# Patient Record
Sex: Female | Born: 1955 | Race: White | Hispanic: No | Smoking: Never smoker
Health system: Southern US, Community
[De-identification: ages and names within clinical notes are randomized; demographics above are authoritative.]

## PROBLEM LIST (undated history)

## (undated) DIAGNOSIS — F329 Major depressive disorder, single episode, unspecified: Secondary | ICD-10-CM

## (undated) DIAGNOSIS — M810 Age-related osteoporosis without current pathological fracture: Secondary | ICD-10-CM

## (undated) DIAGNOSIS — H539 Unspecified visual disturbance: Secondary | ICD-10-CM

## (undated) DIAGNOSIS — F32A Depression, unspecified: Secondary | ICD-10-CM

## (undated) DIAGNOSIS — M1990 Unspecified osteoarthritis, unspecified site: Secondary | ICD-10-CM

## (undated) DIAGNOSIS — R51 Headache: Secondary | ICD-10-CM

## (undated) DIAGNOSIS — G5600 Carpal tunnel syndrome, unspecified upper limb: Secondary | ICD-10-CM

## (undated) DIAGNOSIS — E785 Hyperlipidemia, unspecified: Secondary | ICD-10-CM

## (undated) DIAGNOSIS — G4700 Insomnia, unspecified: Secondary | ICD-10-CM

## (undated) DIAGNOSIS — I10 Essential (primary) hypertension: Secondary | ICD-10-CM

## (undated) DIAGNOSIS — F419 Anxiety disorder, unspecified: Secondary | ICD-10-CM

## (undated) DIAGNOSIS — R519 Headache, unspecified: Secondary | ICD-10-CM

## (undated) HISTORY — DX: Headache: R51

## (undated) HISTORY — DX: Unspecified visual disturbance: H53.9

## (undated) HISTORY — DX: Essential (primary) hypertension: I10

## (undated) HISTORY — PX: COSMETIC SURGERY: SHX468

## (undated) HISTORY — DX: Anxiety disorder, unspecified: F41.9

## (undated) HISTORY — PX: CARPAL TUNNEL RELEASE: SHX101

## (undated) HISTORY — DX: Hyperlipidemia, unspecified: E78.5

## (undated) HISTORY — DX: Age-related osteoporosis without current pathological fracture: M81.0

## (undated) HISTORY — DX: Major depressive disorder, single episode, unspecified: F32.9

## (undated) HISTORY — DX: Unspecified osteoarthritis, unspecified site: M19.90

## (undated) HISTORY — DX: Insomnia, unspecified: G47.00

## (undated) HISTORY — DX: Headache, unspecified: R51.9

## (undated) HISTORY — DX: Depression, unspecified: F32.A

## (undated) HISTORY — DX: Carpal tunnel syndrome, unspecified upper limb: G56.00

---

## 1997-11-30 ENCOUNTER — Ambulatory Visit (HOSPITAL_COMMUNITY): Admission: RE | Admit: 1997-11-30 | Discharge: 1997-11-30 | Payer: Self-pay | Admitting: Orthopedic Surgery

## 1997-11-30 ENCOUNTER — Encounter: Payer: Self-pay | Admitting: Orthopedic Surgery

## 2001-01-20 ENCOUNTER — Other Ambulatory Visit: Admission: RE | Admit: 2001-01-20 | Discharge: 2001-01-20 | Payer: Self-pay | Admitting: *Deleted

## 2015-05-18 DIAGNOSIS — E782 Mixed hyperlipidemia: Secondary | ICD-10-CM | POA: Diagnosis not present

## 2015-05-18 DIAGNOSIS — I10 Essential (primary) hypertension: Secondary | ICD-10-CM | POA: Diagnosis not present

## 2015-05-18 DIAGNOSIS — Z6829 Body mass index (BMI) 29.0-29.9, adult: Secondary | ICD-10-CM | POA: Diagnosis not present

## 2015-05-18 DIAGNOSIS — E669 Obesity, unspecified: Secondary | ICD-10-CM | POA: Diagnosis not present

## 2015-06-06 DIAGNOSIS — I10 Essential (primary) hypertension: Secondary | ICD-10-CM | POA: Diagnosis not present

## 2015-06-06 DIAGNOSIS — R21 Rash and other nonspecific skin eruption: Secondary | ICD-10-CM | POA: Diagnosis not present

## 2015-06-06 DIAGNOSIS — E669 Obesity, unspecified: Secondary | ICD-10-CM | POA: Diagnosis not present

## 2015-06-06 DIAGNOSIS — Z6829 Body mass index (BMI) 29.0-29.9, adult: Secondary | ICD-10-CM | POA: Diagnosis not present

## 2015-06-14 DIAGNOSIS — Z01419 Encounter for gynecological examination (general) (routine) without abnormal findings: Secondary | ICD-10-CM | POA: Diagnosis not present

## 2015-06-14 DIAGNOSIS — Z0000 Encounter for general adult medical examination without abnormal findings: Secondary | ICD-10-CM | POA: Diagnosis not present

## 2015-06-14 DIAGNOSIS — E559 Vitamin D deficiency, unspecified: Secondary | ICD-10-CM | POA: Diagnosis not present

## 2015-06-20 DIAGNOSIS — Z6829 Body mass index (BMI) 29.0-29.9, adult: Secondary | ICD-10-CM | POA: Diagnosis not present

## 2015-06-20 DIAGNOSIS — E669 Obesity, unspecified: Secondary | ICD-10-CM | POA: Diagnosis not present

## 2015-06-20 DIAGNOSIS — R21 Rash and other nonspecific skin eruption: Secondary | ICD-10-CM | POA: Diagnosis not present

## 2015-07-06 ENCOUNTER — Encounter: Payer: Self-pay | Admitting: Physician Assistant

## 2015-07-06 DIAGNOSIS — L905 Scar conditions and fibrosis of skin: Secondary | ICD-10-CM | POA: Diagnosis not present

## 2015-07-06 DIAGNOSIS — N6489 Other specified disorders of breast: Secondary | ICD-10-CM | POA: Diagnosis not present

## 2015-08-01 DIAGNOSIS — Z6829 Body mass index (BMI) 29.0-29.9, adult: Secondary | ICD-10-CM | POA: Diagnosis not present

## 2015-08-01 DIAGNOSIS — E669 Obesity, unspecified: Secondary | ICD-10-CM | POA: Diagnosis not present

## 2015-09-12 DIAGNOSIS — Z6829 Body mass index (BMI) 29.0-29.9, adult: Secondary | ICD-10-CM | POA: Diagnosis not present

## 2015-09-12 DIAGNOSIS — Z008 Encounter for other general examination: Secondary | ICD-10-CM | POA: Diagnosis not present

## 2015-09-12 DIAGNOSIS — I10 Essential (primary) hypertension: Secondary | ICD-10-CM | POA: Diagnosis not present

## 2015-09-12 DIAGNOSIS — E782 Mixed hyperlipidemia: Secondary | ICD-10-CM | POA: Diagnosis not present

## 2015-09-22 ENCOUNTER — Other Ambulatory Visit: Payer: Self-pay | Admitting: *Deleted

## 2015-09-22 NOTE — Telephone Encounter (Signed)
Needs to contact [facility] to continue narcotics meds

## 2015-09-22 NOTE — Telephone Encounter (Signed)
Pharmacy notified need to contact [facility] for refills

## 2015-09-26 NOTE — Telephone Encounter (Signed)
Monica Cross, pt received refill from Dr. Melina Copa. & home phone # is correct

## 2015-09-26 NOTE — Telephone Encounter (Signed)
Home # is not correct & pt does not work at the listed # either.

## 2015-10-19 DIAGNOSIS — E663 Overweight: Secondary | ICD-10-CM | POA: Diagnosis not present

## 2015-10-19 DIAGNOSIS — Z6829 Body mass index (BMI) 29.0-29.9, adult: Secondary | ICD-10-CM | POA: Diagnosis not present

## 2015-10-19 DIAGNOSIS — R5383 Other fatigue: Secondary | ICD-10-CM | POA: Diagnosis not present

## 2015-11-07 DIAGNOSIS — I10 Essential (primary) hypertension: Secondary | ICD-10-CM | POA: Diagnosis not present

## 2015-11-07 DIAGNOSIS — E782 Mixed hyperlipidemia: Secondary | ICD-10-CM | POA: Diagnosis not present

## 2015-11-07 DIAGNOSIS — R899 Unspecified abnormal finding in specimens from other organs, systems and tissues: Secondary | ICD-10-CM | POA: Diagnosis not present

## 2015-11-07 DIAGNOSIS — E559 Vitamin D deficiency, unspecified: Secondary | ICD-10-CM | POA: Diagnosis not present

## 2015-11-09 ENCOUNTER — Ambulatory Visit (INDEPENDENT_AMBULATORY_CARE_PROVIDER_SITE_OTHER): Payer: BLUE CROSS/BLUE SHIELD | Admitting: Physician Assistant

## 2015-11-09 ENCOUNTER — Encounter: Payer: Self-pay | Admitting: Physician Assistant

## 2015-11-09 VITALS — BP 143/81 | HR 72 | Temp 97.1°F | Ht 64.0 in | Wt 183.2 lb

## 2015-11-09 DIAGNOSIS — M170 Bilateral primary osteoarthritis of knee: Secondary | ICD-10-CM | POA: Diagnosis not present

## 2015-11-09 DIAGNOSIS — R51 Headache: Secondary | ICD-10-CM | POA: Diagnosis not present

## 2015-11-09 DIAGNOSIS — R519 Headache, unspecified: Secondary | ICD-10-CM

## 2015-11-09 DIAGNOSIS — F411 Generalized anxiety disorder: Secondary | ICD-10-CM | POA: Diagnosis not present

## 2015-11-09 DIAGNOSIS — I10 Essential (primary) hypertension: Secondary | ICD-10-CM | POA: Insufficient documentation

## 2015-11-09 DIAGNOSIS — Z6831 Body mass index (BMI) 31.0-31.9, adult: Secondary | ICD-10-CM

## 2015-11-09 DIAGNOSIS — E785 Hyperlipidemia, unspecified: Secondary | ICD-10-CM | POA: Insufficient documentation

## 2015-11-09 DIAGNOSIS — G4700 Insomnia, unspecified: Secondary | ICD-10-CM | POA: Diagnosis not present

## 2015-11-09 DIAGNOSIS — F339 Major depressive disorder, recurrent, unspecified: Secondary | ICD-10-CM | POA: Insufficient documentation

## 2015-11-09 MED ORDER — CITALOPRAM HYDROBROMIDE 20 MG PO TABS: 20.0000 mg | ORAL_TABLET | Freq: Every day | ORAL | 5 refills | Status: DC

## 2015-11-09 MED ORDER — ALPRAZOLAM 1 MG PO TABS: 1.0000 mg | ORAL_TABLET | Freq: Three times a day (TID) | ORAL | 2 refills | Status: DC | PRN

## 2015-11-09 MED ORDER — BUSPIRONE HCL 10 MG PO TABS: 10.0000 mg | ORAL_TABLET | Freq: Three times a day (TID) | ORAL | 2 refills | Status: DC

## 2015-11-09 MED ORDER — SUMATRIPTAN SUCCINATE 50 MG PO TABS: 50.0000 mg | ORAL_TABLET | ORAL | 2 refills | Status: DC | PRN

## 2015-11-09 MED ORDER — LOSARTAN POTASSIUM 100 MG PO TABS: 100.0000 mg | ORAL_TABLET | Freq: Every day | ORAL | 6 refills | Status: DC

## 2015-11-09 MED ORDER — DICLOFENAC SODIUM 75 MG PO TBEC: 75.0000 mg | DELAYED_RELEASE_TABLET | Freq: Two times a day (BID) | ORAL | 6 refills | Status: DC

## 2015-11-09 NOTE — Progress Notes (Signed)
BP (!) 143/81   Pulse 72   Temp 97.1 F (36.2 C) (Oral)   Ht 5\' 4"  (1.626 m)   Wt 183 lb 3.2 oz (83.1 kg)   BMI 31.45 kg/m    Subjective:    Patient ID: Monica Cross, female    DOB: Oct 01, 1955, 60 y.o.   MRN: [id number]  Monica Cross is a 60 y.o. female presenting on 11/09/2015 for Headache and Insomnia Patient here to be established as new patient at [facility].  This patient is known to me from [facility]. She needs some of her medications refilled. She has not had her blood pressure medicine for 3 or 4 days. This is most likely what she has a reasonable blood pressure. We will send refills on the things that she needs. She does have a couple of other issues to discuss today.  Headache   This is a new problem. The current episode started more than 1 month ago. The problem occurs intermittently. The problem has been waxing and waning. The pain is located in the right unilateral region. The pain does not radiate. The pain quality is similar to prior headaches. The quality of the pain is described as aching and pulsating. The pain is at a severity of 6/10. The pain is moderate. Associated symptoms include insomnia, nausea, phonophobia, photophobia, scalp tenderness, a visual change and vomiting. Pertinent negatives include no abdominal pain, coughing, fever, numbness or tinnitus. The symptoms are aggravated by bright light and emotional stress. She has tried cold packs and acetaminophen for the symptoms.  Insomnia  Primary symptoms: fragmented sleep, frequent awakening.  The current episode started more than one month. The onset quality is undetermined. The problem occurs every several days. The problem has been gradually worsening since onset. The symptoms are aggravated by emotional upset, work stress and family issues. How many beverages per day that contain caffeine: 0 - 1.  Types of beverages you drink: soda. Nothing relieves the symptoms. Past  treatments include medication. The treatment provided no relief. Typical bedtime:  10-11 P.M..  How long after going to bed to you fall asleep: over an hour.   PMH includes: family stress or anxiety. Prior diagnostic workup includes:  No prior workup.    Relevant past medical, surgical, family and social history reviewed and updated as indicated. Interim medical history since our last visit reviewed. Allergies and medications reviewed and updated.   Data reviewed from any sources in EPIC.  Review of Systems  Constitutional: Negative.  Negative for activity change, fatigue and fever.  HENT: Negative for tinnitus.   Eyes: Positive for photophobia.  Respiratory: Negative.  Negative for cough.   Cardiovascular: Negative.  Negative for chest pain.  Gastrointestinal: Positive for nausea and vomiting. Negative for abdominal pain.  Endocrine: Negative.   Genitourinary: Negative.  Negative for dysuria.  Musculoskeletal: Negative.   Skin: Negative.   Neurological: Positive for headaches. Negative for numbness.  Psychiatric/Behavioral: The patient has insomnia.     Per HPI unless specifically indicated above  Social History   Social History  . Marital status: Single    Spouse name: N/A  . Number of children: N/A  . Years of education: N/A   Occupational History  . Not on file.   Social History Main Topics  . Smoking status: Never Smoker  . Smokeless tobacco: Never Used  . Alcohol use No  . Drug use: No  . Sexual activity: Not on file   Other Topics Concern  .  Not on file   Social History Narrative  . No narrative on file    Past Surgical History:  Procedure Laterality Date  . CARPAL TUNNEL RELEASE    . CESAREAN SECTION    . COSMETIC SURGERY      Family History  Problem Relation Age of Onset  . Heart disease Mother   . Hypertension Mother       Medication List       Accurate as of 11/09/15  3:54 PM. Always use your most recent med list.          ALPRAZolam 1  MG tablet Commonly known as:  XANAX Take 1 tablet (1 mg total) by mouth 3 (three) times daily as needed for anxiety.   atorvastatin 20 MG tablet Commonly known as:  LIPITOR Take 20 mg by mouth daily.   busPIRone 10 MG tablet Commonly known as:  BUSPAR Take 1 tablet (10 mg total) by mouth 3 (three) times daily.   citalopram 20 MG tablet Commonly known as:  CELEXA Take 1 tablet (20 mg total) by mouth daily.   diclofenac 75 MG EC tablet Commonly known as:  VOLTAREN Take 1 tablet (75 mg total) by mouth 2 (two) times daily.   fenofibrate 145 MG tablet Commonly known as:  TRICOR Take 145 mg by mouth daily.   losartan 100 MG tablet Commonly known as:  COZAAR Take 1 tablet (100 mg total) by mouth daily.   SUMAtriptan 50 MG tablet Commonly known as:  IMITREX Take 1 tablet (50 mg total) by mouth every 2 (two) hours as needed for migraine. May repeat in 2 hours if headache persists or recurs.          Objective:    BP (!) 143/81   Pulse 72   Temp 97.1 F (36.2 C) (Oral)   Ht 5\' 4"  (1.626 m)   Wt 183 lb 3.2 oz (83.1 kg)   BMI 31.45 kg/m   No Known Allergies Wt Readings from Last 3 Encounters:  11/09/15 183 lb 3.2 oz (83.1 kg)    Physical Exam  Constitutional: She is oriented to person, place, and time. She appears well-developed and well-nourished.  HENT:  Head: Normocephalic and atraumatic.  Eyes: Conjunctivae and EOM are normal. Pupils are equal, round, and reactive to light.  Cardiovascular: Normal rate, regular rhythm, normal heart sounds and intact distal pulses.   Pulmonary/Chest: Effort normal and breath sounds normal.  Abdominal: Soft. Bowel sounds are normal.  Neurological: She is alert and oriented to person, place, and time. She has normal reflexes.  Skin: Skin is warm and dry. No rash noted.  Psychiatric: She has a normal mood and affect. Her behavior is normal. Judgment and thought content normal.    No results found for this or any previous visit.      Assessment & Plan:   1. Nonintractable headache, unspecified chronicity pattern, unspecified headache type - SUMAtriptan (IMITREX) 50 MG tablet; Take 1 tablet (50 mg total) by mouth every 2 (two) hours as needed for migraine. May repeat in 2 hours if headache persists or recurs.  Dispense: 10 tablet; Refill: 2  2. Insomnia  3. Essential hypertension - losartan (COZAAR) 100 MG tablet; Take 1 tablet (100 mg total) by mouth daily.  Dispense: 30 tablet; Refill: 6  4. Generalized anxiety disorder - busPIRone (BUSPAR) 10 MG tablet; Take 1 tablet (10 mg total) by mouth 3 (three) times daily.  Dispense: 90 tablet; Refill: 2 - citalopram (CELEXA) 20 MG tablet;  Take 1 tablet (20 mg total) by mouth daily.  Dispense: 30 tablet; Refill: 5 - ALPRAZolam (XANAX) 1 MG tablet; Take 1 tablet (1 mg total) by mouth 3 (three) times daily as needed for anxiety.  Dispense: 30 tablet; Refill: 2  5. Primary osteoarthritis of both knees - diclofenac (VOLTAREN) 75 MG EC tablet; Take 1 tablet (75 mg total) by mouth 2 (two) times daily.  Dispense: 60 tablet; Refill: 6  6. Body mass index 31.0-31.9, adult Walking and stretching   Continue all other maintenance medications as listed above. Educational handout given for Vitamin B12 deficiency. Her work labs showed her B12 to be at the low end of normal.  Follow up plan: Return in about 4 weeks (around 12/07/2015).  Terald Sleeper PA-C [facility] [address]  [place], [place] [address] [phone]   11/09/2015, 3:54 PM

## 2015-11-09 NOTE — Patient Instructions (Signed)

## 2015-12-07 ENCOUNTER — Ambulatory Visit: Payer: BLUE CROSS/BLUE SHIELD | Admitting: Physician Assistant

## 2015-12-13 ENCOUNTER — Ambulatory Visit: Payer: BLUE CROSS/BLUE SHIELD | Admitting: Physician Assistant

## 2015-12-13 ENCOUNTER — Ambulatory Visit (INDEPENDENT_AMBULATORY_CARE_PROVIDER_SITE_OTHER): Payer: BLUE CROSS/BLUE SHIELD | Admitting: Physician Assistant

## 2015-12-13 ENCOUNTER — Encounter: Payer: Self-pay | Admitting: Physician Assistant

## 2015-12-13 VITALS — BP 134/84 | HR 78 | Temp 98.3°F | Ht 64.0 in | Wt 184.4 lb

## 2015-12-13 DIAGNOSIS — M62838 Other muscle spasm: Secondary | ICD-10-CM | POA: Diagnosis not present

## 2015-12-13 DIAGNOSIS — F411 Generalized anxiety disorder: Secondary | ICD-10-CM

## 2015-12-13 MED ORDER — METHYLPREDNISOLONE ACETATE 80 MG/ML IJ SUSP
80.0000 mg | Freq: Once | INTRAMUSCULAR | Status: AC
  Administered 2015-12-13: 80 mg via INTRAMUSCULAR

## 2015-12-13 MED ORDER — METHOCARBAMOL 500 MG PO TABS: 500.0000 mg | ORAL_TABLET | Freq: Three times a day (TID) | ORAL | 1 refills | Status: DC | PRN

## 2015-12-13 MED ORDER — ALPRAZOLAM 1 MG PO TABS: 1.0000 mg | ORAL_TABLET | Freq: Three times a day (TID) | ORAL | 2 refills | Status: DC | PRN

## 2015-12-13 NOTE — Patient Instructions (Signed)

## 2015-12-14 ENCOUNTER — Encounter: Payer: Self-pay | Admitting: Physician Assistant

## 2015-12-14 DIAGNOSIS — E559 Vitamin D deficiency, unspecified: Secondary | ICD-10-CM | POA: Diagnosis not present

## 2015-12-14 DIAGNOSIS — R899 Unspecified abnormal finding in specimens from other organs, systems and tissues: Secondary | ICD-10-CM | POA: Diagnosis not present

## 2015-12-14 DIAGNOSIS — E782 Mixed hyperlipidemia: Secondary | ICD-10-CM | POA: Diagnosis not present

## 2015-12-15 NOTE — Progress Notes (Signed)
BP 134/84   Pulse 78   Temp 98.3 F (36.8 C) (Oral)   Ht 5\' 4"  (1.626 m)   Wt 184 lb 6 oz (83.6 kg)   BMI 31.65 kg/m    Subjective:    Patient ID: Monica Cross, female    DOB: 02-21-1955, 60 y.o.   MRN: [id number]  HPI: Monica Cross is a 60 y.o. female presenting on 12/13/2015 for Follow-up (headaches, depression) and Back Pain (muscle spasms on both sides of back, began Sunday night)  This patient is doing much better as far as her headaches and depression is going. The medication is quite good at this time. She states she would like to continue with this amount. We'll plan to recheck that in 3 months.  Patient has had a new onset of cervical muscle spasms. It does go somewhat down between her shoulder blades. She doesn't report any specific injury. She does work at a [employer] with repetitious upper arm movements. She denies any fever or chills.  Past Medical History:  Diagnosis Date  . Depression   . Hyperlipidemia   . Hypertension    Relevant past medical, surgical, family and social history reviewed and updated as indicated. Interim medical history since our last visit reviewed. Allergies and medications reviewed and updated. DATA REVIEWED: CHART IN EPIC  Social History   Social History  . Marital status: Single    Spouse name: N/A  . Number of children: N/A  . Years of education: N/A   Occupational History  . Not on file.   Social History Main Topics  . Smoking status: Never Smoker  . Smokeless tobacco: Never Used  . Alcohol use No  . Drug use: No  . Sexual activity: Not on file   Other Topics Concern  . Not on file   Social History Narrative  . No narrative on file    Past Surgical History:  Procedure Laterality Date  . CARPAL TUNNEL RELEASE    . CESAREAN SECTION    . COSMETIC SURGERY      Family History  Problem Relation Age of Onset  . Heart disease Mother   . Hypertension Mother     Review of Systems  Constitutional: Negative.   Negative for activity change, fatigue and fever.  HENT: Negative.   Eyes: Negative.   Respiratory: Negative.  Negative for cough.   Cardiovascular: Negative.  Negative for chest pain.  Gastrointestinal: Negative.  Negative for abdominal pain.  Endocrine: Negative.   Genitourinary: Negative.  Negative for dysuria.  Musculoskeletal: Positive for arthralgias, back pain, neck pain and neck stiffness.  Skin: Negative.   Neurological: Negative.       Medication List       Accurate as of 12/13/15 11:59 PM. Always use your most recent med list.          ALPRAZolam 1 MG tablet Commonly known as:  XANAX Take 1 tablet (1 mg total) by mouth 3 (three) times daily as needed for anxiety.   atorvastatin 20 MG tablet Commonly known as:  LIPITOR Take 20 mg by mouth daily.   busPIRone 10 MG tablet Commonly known as:  BUSPAR Take 1 tablet (10 mg total) by mouth 3 (three) times daily.   citalopram 20 MG tablet Commonly known as:  CELEXA Take 1 tablet (20 mg total) by mouth daily.   diclofenac 75 MG EC tablet Commonly known as:  VOLTAREN Take 1 tablet (75 mg total) by mouth 2 (two) times daily.   fenofibrate  145 MG tablet Commonly known as:  TRICOR Take 145 mg by mouth daily.   losartan 100 MG tablet Commonly known as:  COZAAR Take 1 tablet (100 mg total) by mouth daily.   methocarbamol 500 MG tablet Commonly known as:  ROBAXIN Take 1 tablet (500 mg total) by mouth every 8 (eight) hours as needed for muscle spasms.   SUMAtriptan 50 MG tablet Commonly known as:  IMITREX Take 1 tablet (50 mg total) by mouth every 2 (two) hours as needed for migraine. May repeat in 2 hours if headache persists or recurs.          Objective:    BP 134/84   Pulse 78   Temp 98.3 F (36.8 C) (Oral)   Ht 5\' 4"  (1.626 m)   Wt 184 lb 6 oz (83.6 kg)   BMI 31.65 kg/m   No Known Allergies  Wt Readings from Last 3 Encounters:  12/13/15 184 lb 6 oz (83.6 kg)  11/09/15 183 lb 3.2 oz (83.1 kg)     Physical Exam  Constitutional: She is oriented to person, place, and time. She appears well-developed and well-nourished.  HENT:  Head: Normocephalic and atraumatic.  Eyes: Conjunctivae and EOM are normal. Pupils are equal, round, and reactive to light.  Cardiovascular: Normal rate, regular rhythm, normal heart sounds and intact distal pulses.   Pulmonary/Chest: Effort normal and breath sounds normal.  Abdominal: Soft. Bowel sounds are normal.  Musculoskeletal:       Cervical back: She exhibits decreased range of motion, tenderness, pain and spasm.       Back:  Neurological: She is alert and oriented to person, place, and time. She has normal reflexes.  Skin: Skin is warm and dry. No rash noted.  Psychiatric: She has a normal mood and affect. Her behavior is normal. Judgment and thought content normal.        Assessment & Plan:   1. Muscle spasms of both lower extremities - methylPREDNISolone acetate (DEPO-MEDROL) injection 80 mg; Inject 1 mL (80 mg total) into the muscle once. - methocarbamol (ROBAXIN) 500 MG tablet; Take 1 tablet (500 mg total) by mouth every 8 (eight) hours as needed for muscle spasms.  Dispense: 90 tablet; Refill: 1  2. Generalized anxiety disorder - ALPRAZolam (XANAX) 1 MG tablet; Take 1 tablet (1 mg total) by mouth 3 (three) times daily as needed for anxiety.  Dispense: 90 tablet; Refill: 2   Continue all other maintenance medications as listed above.  Follow up plan: Return in about 3 months (around 03/14/2016) for recheck.   Educational handout given for spasms   Terald Sleeper PA-C [facility] [address]  [place], [place] [address] [phone]   12/15/2015, 1:09 PM

## 2015-12-28 DIAGNOSIS — D696 Thrombocytopenia, unspecified: Secondary | ICD-10-CM | POA: Diagnosis not present

## 2015-12-28 DIAGNOSIS — R7301 Impaired fasting glucose: Secondary | ICD-10-CM | POA: Diagnosis not present

## 2015-12-28 DIAGNOSIS — E782 Mixed hyperlipidemia: Secondary | ICD-10-CM | POA: Diagnosis not present

## 2016-01-11 ENCOUNTER — Other Ambulatory Visit: Payer: Self-pay | Admitting: Physician Assistant

## 2016-01-11 DIAGNOSIS — M62838 Other muscle spasm: Secondary | ICD-10-CM

## 2016-02-17 ENCOUNTER — Other Ambulatory Visit: Payer: Self-pay | Admitting: *Deleted

## 2016-02-28 ENCOUNTER — Other Ambulatory Visit: Payer: Self-pay | Admitting: *Deleted

## 2016-02-28 NOTE — Telephone Encounter (Signed)
Patient requesting alprazolam 1mg  TID #[id number]. If approved please route to pool to be called into pharmacy.

## 2016-02-28 NOTE — Telephone Encounter (Signed)
Needs appointment, is that correct? I do not see a current one.

## 2016-03-02 NOTE — Telephone Encounter (Signed)
I do not know this patient.

## 2016-03-02 NOTE — Telephone Encounter (Signed)
Denied, I have not seen her

## 2016-03-12 DIAGNOSIS — I10 Essential (primary) hypertension: Secondary | ICD-10-CM | POA: Diagnosis not present

## 2016-04-05 ENCOUNTER — Other Ambulatory Visit: Payer: Self-pay | Admitting: *Deleted

## 2016-04-05 MED ORDER — SUMATRIPTAN SUCCINATE 50 MG PO TABS: ORAL_TABLET | ORAL | 2 refills | Status: DC

## 2016-05-07 DIAGNOSIS — Z6830 Body mass index (BMI) 30.0-30.9, adult: Secondary | ICD-10-CM | POA: Diagnosis not present

## 2016-05-07 DIAGNOSIS — E782 Mixed hyperlipidemia: Secondary | ICD-10-CM | POA: Diagnosis not present

## 2016-05-07 DIAGNOSIS — Z008 Encounter for other general examination: Secondary | ICD-10-CM | POA: Diagnosis not present

## 2016-05-07 DIAGNOSIS — R7301 Impaired fasting glucose: Secondary | ICD-10-CM | POA: Diagnosis not present

## 2016-05-07 DIAGNOSIS — I10 Essential (primary) hypertension: Secondary | ICD-10-CM | POA: Diagnosis not present

## 2016-05-07 DIAGNOSIS — Z719 Counseling, unspecified: Secondary | ICD-10-CM | POA: Diagnosis not present

## 2016-05-07 DIAGNOSIS — E669 Obesity, unspecified: Secondary | ICD-10-CM | POA: Diagnosis not present

## 2016-05-10 ENCOUNTER — Other Ambulatory Visit: Payer: Self-pay | Admitting: Physician Assistant

## 2016-05-16 DIAGNOSIS — E782 Mixed hyperlipidemia: Secondary | ICD-10-CM | POA: Diagnosis not present

## 2016-05-16 DIAGNOSIS — Z79899 Other long term (current) drug therapy: Secondary | ICD-10-CM | POA: Diagnosis not present

## 2016-05-16 DIAGNOSIS — Z139 Encounter for screening, unspecified: Secondary | ICD-10-CM | POA: Diagnosis not present

## 2016-05-16 DIAGNOSIS — D696 Thrombocytopenia, unspecified: Secondary | ICD-10-CM | POA: Diagnosis not present

## 2016-05-16 DIAGNOSIS — E559 Vitamin D deficiency, unspecified: Secondary | ICD-10-CM | POA: Diagnosis not present

## 2016-05-30 DIAGNOSIS — E782 Mixed hyperlipidemia: Secondary | ICD-10-CM | POA: Diagnosis not present

## 2016-05-30 DIAGNOSIS — R7301 Impaired fasting glucose: Secondary | ICD-10-CM | POA: Diagnosis not present

## 2016-05-30 DIAGNOSIS — I10 Essential (primary) hypertension: Secondary | ICD-10-CM | POA: Diagnosis not present

## 2016-06-05 ENCOUNTER — Other Ambulatory Visit: Payer: Self-pay | Admitting: Physician Assistant

## 2016-06-13 ENCOUNTER — Other Ambulatory Visit: Payer: Self-pay | Admitting: Physician Assistant

## 2016-06-20 ENCOUNTER — Encounter: Payer: Self-pay | Admitting: Physician Assistant

## 2016-06-20 ENCOUNTER — Ambulatory Visit (INDEPENDENT_AMBULATORY_CARE_PROVIDER_SITE_OTHER): Payer: BLUE CROSS/BLUE SHIELD | Admitting: Physician Assistant

## 2016-06-20 VITALS — BP 146/83 | HR 72 | Temp 97.0°F | Ht 64.0 in | Wt 189.0 lb

## 2016-06-20 DIAGNOSIS — E7800 Pure hypercholesterolemia, unspecified: Secondary | ICD-10-CM | POA: Diagnosis not present

## 2016-06-20 DIAGNOSIS — F332 Major depressive disorder, recurrent severe without psychotic features: Secondary | ICD-10-CM | POA: Diagnosis not present

## 2016-06-20 DIAGNOSIS — F411 Generalized anxiety disorder: Secondary | ICD-10-CM | POA: Diagnosis not present

## 2016-06-20 DIAGNOSIS — M5136 Other intervertebral disc degeneration, lumbar region: Secondary | ICD-10-CM | POA: Diagnosis not present

## 2016-06-20 DIAGNOSIS — R519 Headache, unspecified: Secondary | ICD-10-CM

## 2016-06-20 DIAGNOSIS — M170 Bilateral primary osteoarthritis of knee: Secondary | ICD-10-CM

## 2016-06-20 DIAGNOSIS — G43109 Migraine with aura, not intractable, without status migrainosus: Secondary | ICD-10-CM | POA: Diagnosis not present

## 2016-06-20 DIAGNOSIS — R51 Headache: Secondary | ICD-10-CM

## 2016-06-20 DIAGNOSIS — I10 Essential (primary) hypertension: Secondary | ICD-10-CM

## 2016-06-20 MED ORDER — SUMATRIPTAN SUCCINATE 50 MG PO TABS: 50.0000 mg | ORAL_TABLET | ORAL | 5 refills | Status: DC | PRN

## 2016-06-20 MED ORDER — CITALOPRAM HYDROBROMIDE 20 MG PO TABS: 20.0000 mg | ORAL_TABLET | Freq: Every day | ORAL | 5 refills | Status: DC

## 2016-06-20 MED ORDER — DICLOFENAC SODIUM 75 MG PO TBEC: 75.0000 mg | DELAYED_RELEASE_TABLET | Freq: Two times a day (BID) | ORAL | 6 refills | Status: DC

## 2016-06-20 MED ORDER — LORATADINE 10 MG PO TABS: 10.0000 mg | ORAL_TABLET | Freq: Every day | ORAL | 11 refills | Status: DC

## 2016-06-20 MED ORDER — ALBUTEROL SULFATE HFA 108 (90 BASE) MCG/ACT IN AERS: 2.0000 | INHALATION_SPRAY | Freq: Four times a day (QID) | RESPIRATORY_TRACT | 0 refills | Status: DC | PRN

## 2016-06-20 MED ORDER — BUSPIRONE HCL 10 MG PO TABS: 10.0000 mg | ORAL_TABLET | Freq: Three times a day (TID) | ORAL | 5 refills | Status: DC

## 2016-06-20 MED ORDER — ALPRAZOLAM 1 MG PO TABS: 1.0000 mg | ORAL_TABLET | Freq: Three times a day (TID) | ORAL | 2 refills | Status: DC | PRN

## 2016-06-20 MED ORDER — PREDNISONE 10 MG (21) PO TBPK: ORAL_TABLET | ORAL | 0 refills | Status: DC

## 2016-06-20 NOTE — Patient Instructions (Signed)

## 2016-06-22 NOTE — Progress Notes (Signed)
BP (!) 146/83   Pulse 72   Temp 97 F (36.1 C) (Oral)   Ht 5\' 4"  (1.626 m)   Wt 189 lb (85.7 kg)   BMI 32.44 kg/m    Subjective:    Patient ID: Tykira Wachs, female    DOB: 1955-07-23, 61 y.o.   MRN: [id number]  HPI: Alaysha Jefcoat is a 61 y.o. female presenting on 06/20/2016 for Follow-up (pt here today for routine follow up of her chronic medical conditions and also c/o cough that won't go away.)  This patient comes in for periodic recheck on medications and conditions including Hypertension, elevated cholesterol, depression, allergic rhinitis and a persistent cough. She has had problems with cough and wheezing particularly in the spring allergy season is quite bad. She does have a significant improvement in her home life. Her husband has been going for many months now. They needed to have separated along time ago. She is raising a teenage granddaughter but this granddaughter is very good. Her mother is the bad influence. She will finish high school soon his excellent with helping [employer].   All medications are reviewed today. There are no reports of any problems with the medications. All of the medical conditions are reviewed and updated.  Lab work is reviewed and will be ordered as medically necessary. There are no new problems reported with today's visit.   Relevant past medical, surgical, family and social history reviewed and updated as indicated. Allergies and medications reviewed and updated.  Past Medical History:  Diagnosis Date  . Depression   . Hyperlipidemia   . Hypertension     Past Surgical History:  Procedure Laterality Date  . CARPAL TUNNEL RELEASE    . CESAREAN SECTION    . COSMETIC SURGERY      Review of Systems  Constitutional: Negative.  Negative for activity change, fatigue and fever.  HENT: Positive for postnasal drip and rhinorrhea.   Eyes: Negative.   Respiratory: Positive for cough.   Cardiovascular: Negative.  Negative for chest pain.    Gastrointestinal: Negative.  Negative for abdominal pain.  Endocrine: Negative.   Genitourinary: Negative.  Negative for dysuria.  Musculoskeletal: Positive for arthralgias, back pain and myalgias.  Skin: Negative.   Neurological: Negative.   Psychiatric/Behavioral: Positive for dysphoric mood. The patient is nervous/anxious.     Allergies as of 06/20/2016   No Known Allergies     Medication List       Accurate as of 06/20/16 11:59 PM. Always use your most recent med list.          albuterol 108 (90 Base) MCG/ACT inhaler Commonly known as:  PROVENTIL HFA;VENTOLIN HFA Inhale 2 puffs into the lungs every 6 (six) hours as needed for wheezing or shortness of breath.   ALPRAZolam 1 MG tablet Commonly known as:  XANAX Take 1 tablet (1 mg total) by mouth 3 (three) times daily as needed for anxiety.   atorvastatin 20 MG tablet Commonly known as:  LIPITOR Take 20 mg by mouth daily.   busPIRone 10 MG tablet Commonly known as:  BUSPAR Take 1 tablet (10 mg total) by mouth 3 (three) times daily.   citalopram 20 MG tablet Commonly known as:  CELEXA Take 1 tablet (20 mg total) by mouth daily.   diclofenac 75 MG EC tablet Commonly known as:  VOLTAREN Take 1 tablet (75 mg total) by mouth 2 (two) times daily.   fenofibrate 145 MG tablet Commonly known as:  TRICOR Take 145 mg  by mouth daily.   loratadine 10 MG tablet Commonly known as:  CLARITIN Take 1 tablet (10 mg total) by mouth daily.   losartan 100 MG tablet Commonly known as:  COZAAR Take 1 tablet (100 mg total) by mouth daily.   methocarbamol 500 MG tablet Commonly known as:  ROBAXIN Take 1 Tablet by mouth every 8 hours as needed for muscle spasms   predniSONE 10 MG (21) Tbpk tablet Commonly known as:  STERAPRED UNI-PAK 21 TAB As directed x 6 days   SUMAtriptan 50 MG tablet Commonly known as:  IMITREX Take one tab PO every two hours PRN for migraine. May repeat in two hours is HA persists.   SUMAtriptan 50 MG  tablet Commonly known as:  IMITREX Take 1 tablet (50 mg total) by mouth every 2 (two) hours as needed for migraine. May repeat in 2 hours if headache persists or recurs.          Objective:    BP (!) 146/83   Pulse 72   Temp 97 F (36.1 C) (Oral)   Ht 5\' 4"  (1.626 m)   Wt 189 lb (85.7 kg)   BMI 32.44 kg/m   No Known Allergies  Physical Exam  Constitutional: She is oriented to person, place, and time. She appears well-developed and well-nourished.  HENT:  Head: Normocephalic and atraumatic.  Right Ear: Tympanic membrane, external ear and ear canal normal.  Left Ear: Tympanic membrane, external ear and ear canal normal.  Nose: Nose normal. No rhinorrhea.  Mouth/Throat: Oropharynx is clear and moist and mucous membranes are normal. No oropharyngeal exudate or posterior oropharyngeal erythema.  Eyes: Conjunctivae and EOM are normal. Pupils are equal, round, and reactive to light.  Neck: Normal range of motion. Neck supple.  Cardiovascular: Normal rate, regular rhythm, normal heart sounds and intact distal pulses.   Pulmonary/Chest: Effort normal and breath sounds normal. She has no wheezes. She has no rales. She exhibits no tenderness.  Abdominal: Soft. Bowel sounds are normal.  Neurological: She is alert and oriented to person, place, and time. She has normal reflexes.  Skin: Skin is warm and dry. No rash noted.  Psychiatric: She has a normal mood and affect. Her behavior is normal. Judgment and thought content normal.    No results found for this or any previous visit.    Assessment & Plan:   1. Essential hypertension  2. Pure hypercholesterolemia  3. Severe episode of recurrent major depressive disorder, without psychotic features ([place])  4. DDD (degenerative disc disease), lumbar  5. GAD (generalized anxiety disorder)  6. Migraine with aura and without status migrainosus, not intractable  7. Generalized anxiety disorder - ALPRAZolam (XANAX) 1 MG tablet; Take 1  tablet (1 mg total) by mouth 3 (three) times daily as needed for anxiety.  Dispense: 90 tablet; Refill: 2 - busPIRone (BUSPAR) 10 MG tablet; Take 1 tablet (10 mg total) by mouth 3 (three) times daily.  Dispense: 90 tablet; Refill: 5 - citalopram (CELEXA) 20 MG tablet; Take 1 tablet (20 mg total) by mouth daily.  Dispense: 30 tablet; Refill: 5  8. Nonintractable headache, unspecified chronicity pattern, unspecified headache type - SUMAtriptan (IMITREX) 50 MG tablet; Take 1 tablet (50 mg total) by mouth every 2 (two) hours as needed for migraine. May repeat in 2 hours if headache persists or recurs.  Dispense: 9 tablet; Refill: 5  9. Primary osteoarthritis of both knees - diclofenac (VOLTAREN) 75 MG EC tablet; Take 1 tablet (75 mg total) by mouth  2 (two) times daily.  Dispense: 60 tablet; Refill: 6   Current Outpatient Prescriptions:  .  ALPRAZolam (XANAX) 1 MG tablet, Take 1 tablet (1 mg total) by mouth 3 (three) times daily as needed for anxiety., Disp: 90 tablet, Rfl: 2 .  atorvastatin (LIPITOR) 20 MG tablet, Take 20 mg by mouth daily., Disp: , Rfl:  .  busPIRone (BUSPAR) 10 MG tablet, Take 1 tablet (10 mg total) by mouth 3 (three) times daily., Disp: 90 tablet, Rfl: 5 .  citalopram (CELEXA) 20 MG tablet, Take 1 tablet (20 mg total) by mouth daily., Disp: 30 tablet, Rfl: 5 .  diclofenac (VOLTAREN) 75 MG EC tablet, Take 1 tablet (75 mg total) by mouth 2 (two) times daily., Disp: 60 tablet, Rfl: 6 .  fenofibrate (TRICOR) 145 MG tablet, Take 145 mg by mouth daily., Disp: , Rfl:  .  losartan (COZAAR) 100 MG tablet, Take 1 tablet (100 mg total) by mouth daily., Disp: 30 tablet, Rfl: 6 .  methocarbamol (ROBAXIN) 500 MG tablet, Take 1 Tablet by mouth every 8 hours as needed for muscle spasms, Disp: 90 tablet, Rfl: 6 .  SUMAtriptan (IMITREX) 50 MG tablet, Take 1 tablet (50 mg total) by mouth every 2 (two) hours as needed for migraine. May repeat in 2 hours if headache persists or recurs., Disp: 9  tablet, Rfl: 5 .  albuterol (PROVENTIL HFA;VENTOLIN HFA) 108 (90 Base) MCG/ACT inhaler, Inhale 2 puffs into the lungs every 6 (six) hours as needed for wheezing or shortness of breath., Disp: 1 Inhaler, Rfl: 0 .  loratadine (CLARITIN) 10 MG tablet, Take 1 tablet (10 mg total) by mouth daily., Disp: 30 tablet, Rfl: 11 .  predniSONE (STERAPRED UNI-PAK 21 TAB) 10 MG (21) TBPK tablet, As directed x 6 days, Disp: 21 tablet, Rfl: 0 .  SUMAtriptan (IMITREX) 50 MG tablet, Take one tab PO every two hours PRN for migraine. May repeat in two hours is HA persists., Disp: 21 tablet, Rfl: 2  Continue all other maintenance medications as listed above.  Follow up plan: Return in about 6 months (around 12/21/2016) for recheck meds.  Educational handout given for cholesterol  Terald Sleeper PA-C [facility] [address]  [place], [place] [address] [phone]   06/22/2016, 10:49 AM

## 2016-08-17 ENCOUNTER — Other Ambulatory Visit: Payer: Self-pay | Admitting: Physician Assistant

## 2016-08-17 ENCOUNTER — Encounter: Payer: Self-pay | Admitting: *Deleted

## 2016-08-17 DIAGNOSIS — F411 Generalized anxiety disorder: Secondary | ICD-10-CM

## 2016-08-17 NOTE — Telephone Encounter (Signed)
Error called patient and she has one refill left of the xananx

## 2016-08-17 NOTE — Telephone Encounter (Signed)
Looks like

## 2016-09-10 DIAGNOSIS — Z008 Encounter for other general examination: Secondary | ICD-10-CM | POA: Diagnosis not present

## 2016-09-10 DIAGNOSIS — E669 Obesity, unspecified: Secondary | ICD-10-CM | POA: Diagnosis not present

## 2016-09-10 DIAGNOSIS — R7301 Impaired fasting glucose: Secondary | ICD-10-CM | POA: Diagnosis not present

## 2016-09-10 DIAGNOSIS — E782 Mixed hyperlipidemia: Secondary | ICD-10-CM | POA: Diagnosis not present

## 2016-09-10 DIAGNOSIS — Z6830 Body mass index (BMI) 30.0-30.9, adult: Secondary | ICD-10-CM | POA: Diagnosis not present

## 2016-09-10 DIAGNOSIS — I10 Essential (primary) hypertension: Secondary | ICD-10-CM | POA: Diagnosis not present

## 2016-09-10 DIAGNOSIS — Z719 Counseling, unspecified: Secondary | ICD-10-CM | POA: Diagnosis not present

## 2016-09-17 ENCOUNTER — Other Ambulatory Visit: Payer: Self-pay | Admitting: Physician Assistant

## 2016-09-17 DIAGNOSIS — F411 Generalized anxiety disorder: Secondary | ICD-10-CM

## 2016-09-17 NOTE — Telephone Encounter (Signed)
Last seen 06/20/16  Glenard Haring  If approved route to nurse to call into [facility],

## 2016-09-17 NOTE — Telephone Encounter (Signed)
Phoned in.

## 2016-09-26 ENCOUNTER — Telehealth: Payer: Self-pay | Admitting: Physician Assistant

## 2016-09-27 ENCOUNTER — Telehealth: Payer: Self-pay | Admitting: Physician Assistant

## 2016-09-27 NOTE — Telephone Encounter (Signed)
Yes, an appointment in the next week or so. Call if anything worsens.

## 2016-09-27 NOTE — Telephone Encounter (Signed)
Pt has an appt with Glenard Haring 10/10/16 and she will call back if she becomes symptomatic or BP's get worse.

## 2016-10-08 DIAGNOSIS — I10 Essential (primary) hypertension: Secondary | ICD-10-CM | POA: Diagnosis not present

## 2016-10-10 ENCOUNTER — Ambulatory Visit (INDEPENDENT_AMBULATORY_CARE_PROVIDER_SITE_OTHER): Payer: BLUE CROSS/BLUE SHIELD | Admitting: Physician Assistant

## 2016-10-10 VITALS — BP 132/83 | HR 67 | Temp 97.4°F | Ht 64.0 in | Wt 187.0 lb

## 2016-10-10 DIAGNOSIS — Z87828 Personal history of other (healed) physical injury and trauma: Secondary | ICD-10-CM | POA: Diagnosis not present

## 2016-10-10 DIAGNOSIS — T2101XD Burn of unspecified degree of chest wall, subsequent encounter: Secondary | ICD-10-CM | POA: Diagnosis not present

## 2016-10-10 DIAGNOSIS — Z01419 Encounter for gynecological examination (general) (routine) without abnormal findings: Secondary | ICD-10-CM | POA: Diagnosis not present

## 2016-10-10 DIAGNOSIS — Z1239 Encounter for other screening for malignant neoplasm of breast: Secondary | ICD-10-CM | POA: Diagnosis not present

## 2016-10-10 DIAGNOSIS — F411 Generalized anxiety disorder: Secondary | ICD-10-CM

## 2016-10-10 MED ORDER — ALPRAZOLAM 1 MG PO TABS: 1.0000 mg | ORAL_TABLET | Freq: Three times a day (TID) | ORAL | 5 refills | Status: DC | PRN

## 2016-10-10 NOTE — Patient Instructions (Signed)
In a few days you may receive a survey in the mail or online from [employer] regarding your visit with us today. Please take a moment to fill this out. Your feedback is very important to our whole office. It can help us better understand your needs as well as improve your experience and satisfaction. Thank you for taking your time to complete it. We care about you.  Elliette Seabolt, PA-C  

## 2016-10-11 ENCOUNTER — Other Ambulatory Visit: Payer: Self-pay | Admitting: Physician Assistant

## 2016-10-11 DIAGNOSIS — M62838 Other muscle spasm: Secondary | ICD-10-CM

## 2016-10-11 LAB — PAP IG W/ RFLX HPV ASCU: PAP Smear Comment: 0

## 2016-10-12 ENCOUNTER — Encounter: Payer: Self-pay | Admitting: Physician Assistant
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
## 2016-10-16 ENCOUNTER — Other Ambulatory Visit: Payer: Self-pay | Admitting: Physician Assistant

## 2016-10-16 DIAGNOSIS — F411 Generalized anxiety disorder: Secondary | ICD-10-CM

## 2016-10-17 DIAGNOSIS — E559 Vitamin D deficiency, unspecified: Secondary | ICD-10-CM | POA: Diagnosis not present

## 2016-10-17 DIAGNOSIS — E782 Mixed hyperlipidemia: Secondary | ICD-10-CM | POA: Diagnosis not present

## 2016-10-17 DIAGNOSIS — Z139 Encounter for screening, unspecified: Secondary | ICD-10-CM | POA: Diagnosis not present

## 2016-10-17 DIAGNOSIS — Z79899 Other long term (current) drug therapy: Secondary | ICD-10-CM | POA: Diagnosis not present

## 2016-10-22 NOTE — Telephone Encounter (Signed)
Patient seen on 08/29 by Glenard Haring.

## 2016-11-05 DIAGNOSIS — E782 Mixed hyperlipidemia: Secondary | ICD-10-CM | POA: Diagnosis not present

## 2016-11-05 DIAGNOSIS — I10 Essential (primary) hypertension: Secondary | ICD-10-CM | POA: Diagnosis not present

## 2016-11-05 DIAGNOSIS — R799 Abnormal finding of blood chemistry, unspecified: Secondary | ICD-10-CM | POA: Diagnosis not present

## 2016-11-05 DIAGNOSIS — R7301 Impaired fasting glucose: Secondary | ICD-10-CM | POA: Diagnosis not present

## 2016-11-13 ENCOUNTER — Other Ambulatory Visit: Payer: Self-pay | Admitting: Physician Assistant

## 2016-11-13 DIAGNOSIS — F411 Generalized anxiety disorder: Secondary | ICD-10-CM

## 2016-11-29 ENCOUNTER — Other Ambulatory Visit: Payer: Self-pay | Admitting: Physician Assistant

## 2016-11-29 DIAGNOSIS — R519 Headache, unspecified: Secondary | ICD-10-CM

## 2016-11-29 DIAGNOSIS — R51 Headache: Principal | ICD-10-CM

## 2016-11-29 DIAGNOSIS — F411 Generalized anxiety disorder: Secondary | ICD-10-CM

## 2016-12-03 DIAGNOSIS — R799 Abnormal finding of blood chemistry, unspecified: Secondary | ICD-10-CM | POA: Diagnosis not present

## 2016-12-07 DIAGNOSIS — I868 Varicose veins of other specified sites: Secondary | ICD-10-CM | POA: Diagnosis not present

## 2016-12-17 DIAGNOSIS — Z712 Person consulting for explanation of examination or test findings: Secondary | ICD-10-CM | POA: Diagnosis not present

## 2016-12-17 DIAGNOSIS — Z719 Counseling, unspecified: Secondary | ICD-10-CM | POA: Diagnosis not present

## 2016-12-20 ENCOUNTER — Telehealth: Payer: Self-pay | Admitting: Physician Assistant

## 2016-12-20 MED ORDER — CYCLOBENZAPRINE HCL 10 MG PO TABS: 10.0000 mg | ORAL_TABLET | Freq: Three times a day (TID) | ORAL | 0 refills | Status: DC | PRN

## 2016-12-20 NOTE — Telephone Encounter (Signed)
Pt aware.

## 2016-12-24 ENCOUNTER — Ambulatory Visit: Payer: BLUE CROSS/BLUE SHIELD | Admitting: Physician Assistant

## 2017-02-01 ENCOUNTER — Ambulatory Visit (INDEPENDENT_AMBULATORY_CARE_PROVIDER_SITE_OTHER): Payer: BLUE CROSS/BLUE SHIELD

## 2017-02-01 ENCOUNTER — Ambulatory Visit: Payer: BLUE CROSS/BLUE SHIELD | Admitting: Nurse Practitioner

## 2017-02-01 ENCOUNTER — Encounter: Payer: Self-pay | Admitting: Nurse Practitioner

## 2017-02-01 VITALS — BP 175/91 | HR 77 | Temp 97.4°F | Ht 64.0 in | Wt 189.0 lb

## 2017-02-01 DIAGNOSIS — M7551 Bursitis of right shoulder: Secondary | ICD-10-CM | POA: Diagnosis not present

## 2017-02-01 DIAGNOSIS — M25511 Pain in right shoulder: Secondary | ICD-10-CM

## 2017-02-01 MED ORDER — METHYLPREDNISOLONE ACETATE 80 MG/ML IJ SUSP
40.0000 mg | Freq: Once | INTRAMUSCULAR | Status: AC
  Administered 2017-02-01: 40 mg via INTRA_ARTICULAR

## 2017-02-01 MED ORDER — BUPIVACAINE HCL 0.25 % IJ SOLN
1.0000 mL | Freq: Once | INTRAMUSCULAR | Status: AC
  Administered 2017-02-01: 1 mL via INTRA_ARTICULAR

## 2017-02-01 NOTE — Patient Instructions (Signed)
Bursitis Bursitis is when the fluid-filled sac (bursa) that covers and protects a joint is swollen (inflamed). Bursitis is most common near joints, especially the knees, elbows, hips, and shoulders. Follow these instructions at home:  Take medicines only as told by your doctor.  If you were prescribed an antibiotic medicine, finish it all even if you start to feel better.  Rest the affected area as told by your doctor. ? Keep the area raised up. ? Avoid doing things that make the pain worse.  Apply ice to the injured area: ? Place ice in a plastic bag. ? Place a towel between your skin and the bag. ? Leave the ice on for 20 minutes, 2-3 times a day.  Use splints, braces, pads, or walking aids as told by your doctor.  Keep all follow-up visits as told by your doctor. This is important. Contact a doctor if:  You have more pain with home care.  You have a fever.  You have chills. This information is not intended to replace advice given to you by your health care provider. Make sure you discuss any questions you have with your health care provider. Document Released: 07/19/2009 Document Revised: 07/07/2015 Document Reviewed: 04/20/2013 Elsevier Interactive Patient Education  2018 Elsevier Inc.  

## 2017-02-01 NOTE — Progress Notes (Signed)
   Subjective:    Patient ID: Monica Cross, female    DOB: 13-Sep-1955, 61 y.o.   MRN: [id number]  HPI Patient comes in today c/o right shoulder pain that radiates around back to left shoulder. Pain is worse with reaching with right arm. sytarted about 1 month ago. Rates pain 8/10 now just sitting still. Has had it injected in the past, which helped.    Review of Systems  Constitutional: Negative.   Respiratory: Negative.   Cardiovascular: Negative.   Musculoskeletal: Positive for arthralgias (right shoulder).  Neurological: Negative.   Psychiatric/Behavioral: Negative.   All other systems reviewed and are negative.      Objective:   Physical Exam  Constitutional: She is oriented to person, place, and time. She appears well-developed and well-nourished. No distress.  Cardiovascular: Normal rate.  Pulmonary/Chest: Effort normal.  Musculoskeletal:  No pain on palpation of right shoulder.  Decreased ROM due to pain on extension and abduction.  Grips equal bil  Neurological: She is alert and oriented to person, place, and time.  Skin: Skin is warm.  Psychiatric: She has a normal mood and affect. Her behavior is normal. Judgment and thought content normal.     BP (!) 175/91   Pulse 77   Temp (!) 97.4 F (36.3 C) (Oral)   Ht 5\' 4"  (1.626 m)   Wt 189 lb (85.7 kg)   BMI 32.44 kg/m   Xray- right shoulder- normal  Right shoulder injection under sterile technique- patient tolerated well.     Assessment & Plan:   1. Acute pain of right shoulder   2. Bursitis of right shoulder    Ice bid Rest rto prn  Mary-Margaret Hassell Done, FNP

## 2017-02-15 ENCOUNTER — Ambulatory Visit: Payer: BLUE CROSS/BLUE SHIELD | Admitting: Family Medicine

## 2017-02-15 ENCOUNTER — Encounter: Payer: Self-pay | Admitting: Family Medicine

## 2017-02-15 VITALS — BP 139/85 | HR 87 | Temp 98.0°F | Ht 64.0 in | Wt 191.0 lb

## 2017-02-15 DIAGNOSIS — J069 Acute upper respiratory infection, unspecified: Secondary | ICD-10-CM | POA: Diagnosis not present

## 2017-02-15 MED ORDER — BENZONATATE 200 MG PO CAPS: 200.0000 mg | ORAL_CAPSULE | Freq: Two times a day (BID) | ORAL | 0 refills | Status: DC | PRN

## 2017-02-15 NOTE — Patient Instructions (Signed)
I value your feedback and appreciate you entrusting [place] with your care.  If you get a survey, I would appreciate your taking the time to let us know what your experience was like.  It appears that you have a viral upper respiratory infection (cold).  Cold symptoms can last up to 2 weeks.  I recommend that you only use cold medications that are safe in high blood pressure like Coricidin (generic is fine).  Other cold medications can increase your blood pressure.    - Get plenty of rest and drink plenty of fluids. - Try to breathe moist air. Use a cold mist humidifier. - Consume warm fluids (soup or tea) to provide relief for a stuffy nose and to loosen phlegm. - For nasal stuffiness, try saline nasal spray or a Neti Pot.  Afrin nasal spray can also be used but this product should not be used longer than 3 days or it will cause rebound nasal stuffiness (worsening nasal congestion). - For sore throat pain relief: suck on throat lozenges, hard candy or popsicles; gargle with warm salt water (1/4 tsp. salt per 8 oz. of water); and eat soft, bland foods. - Eat a well-balanced diet. If you cannot, ensure you are getting enough nutrients by taking a daily multivitamin. - Avoid dairy products, as they can thicken phlegm. - Avoid alcohol, as it impairs your body's immune system.  CONTACT YOUR DOCTOR IF YOU EXPERIENCE ANY OF THE FOLLOWING: - High fever - Ear pain - Sinus-type headache - Unusually severe cold symptoms - Cough that gets worse while other cold symptoms improve - Flare up of any chronic lung problem, such as asthma - Your symptoms persist longer than 2 weeks

## 2017-02-15 NOTE — Progress Notes (Signed)
Subjective: CC: ?flu PCP: Terald Sleeper, PA-C VQM:[id number] Mette is a 62 y.o. female presenting to clinic today for:  1. Cold symptoms  Patient reports productive cough with thick mucus that started 1 week ago.  She notes that a coworker was sick with a cough recently.  She reports occasional shortness of breath associated with coughing.  Denies hemoptysis, sinus pressure, headache, dizziness, rash, nausea, vomiting, diarrhea, fevers, chills, myalgia, recent travel.  Patient has used Mucinex and an over-the-counter cough syrup with some relief of symptoms.  Denies history of COPD or asthma.  Denies tobacco use/ exposure.  ROS: Per HPI  No Known Allergies Past Medical History:  Diagnosis Date  . Depression   . Hyperlipidemia   . Hypertension     Current Outpatient Medications:  .  albuterol (PROVENTIL HFA;VENTOLIN HFA) 108 (90 Base) MCG/ACT inhaler, Inhale 2 puffs into the lungs every 6 (six) hours as needed for wheezing or shortness of breath., Disp: 1 Inhaler, Rfl: 0 .  ALPRAZolam (XANAX) 1 MG tablet, Take 1 tablet (1 mg total) by mouth 3 (three) times daily as needed for anxiety., Disp: 90 tablet, Rfl: 5 .  atorvastatin (LIPITOR) 20 MG tablet, Take 20 mg by mouth daily., Disp: , Rfl:  .  busPIRone (BUSPAR) 10 MG tablet, take 1  Tablet by mouth 3 times daily, Disp: 90 tablet, Rfl: 5 .  citalopram (CELEXA) 20 MG tablet, Take 1 Tablet by mouth once daily, Disp: 90 tablet, Rfl: 0 .  cyclobenzaprine (FLEXERIL) 10 MG tablet, Take 1 tablet (10 mg total) 3 (three) times daily as needed by mouth for muscle spasms., Disp: 60 tablet, Rfl: 0 .  diclofenac (VOLTAREN) 75 MG EC tablet, Take 1 tablet (75 mg total) by mouth 2 (two) times daily., Disp: 60 tablet, Rfl: 6 .  fenofibrate (TRICOR) 145 MG tablet, Take 145 mg by mouth daily., Disp: , Rfl:  .  loratadine (CLARITIN) 10 MG tablet, Take 1 tablet (10 mg total) by mouth daily., Disp: 30 tablet, Rfl: 11 .  losartan (COZAAR) 100 MG tablet, Take 1  tablet (100 mg total) by mouth daily., Disp: 30 tablet, Rfl: 6 .  methocarbamol (ROBAXIN) 500 MG tablet, Take 1 Tablet by mouth every 8 hours as needed for muscle spasms, Disp: 90 tablet, Rfl: 1 .  SUMAtriptan (IMITREX) 50 MG tablet, TAKE ONE TABLET BY MOUTH every TWO hours as needed FOR migraine - MAY REPEAT in TWO HOURS if headache persists OR recurs, Disp: 9 tablet, Rfl: 5 Social History   Socioeconomic History  . Marital status: Single    Spouse name: Not on file  . Number of children: Not on file  . Years of education: Not on file  . Highest education level: Not on file  Social Needs  . Financial resource strain: Not on file  . Food insecurity - worry: Not on file  . Food insecurity - inability: Not on file  . Transportation needs - medical: Not on file  . Transportation needs - non-medical: Not on file  Occupational History  . Not on file  Tobacco Use  . Smoking status: Never Smoker  . Smokeless tobacco: Never Used  Substance and Sexual Activity  . Alcohol use: No  . Drug use: No  . Sexual activity: Not on file  Other Topics Concern  . Not on file  Social History Narrative  . Not on file   Family History  Problem Relation Age of Onset  . Heart disease Mother   .  Hypertension Mother     Objective: Office vital signs reviewed. BP 139/85   Pulse 87   Temp 98 F (36.7 C) (Oral)   Ht 5\' 4"  (1.626 m)   Wt 191 lb (86.6 kg)   SpO2 97%   BMI 32.79 kg/m   Physical Examination:  General: Awake, alert, well nourished, nontoxic appearing, No acute distress HEENT: Normal    Neck: No masses palpated. No lymphadenopathy    Ears: Tympanic membranes intact, normal light reflex, no erythema, no bulging    Eyes: PERRLA, extraocular membranes intact, sclera white    Nose: nasal turbinates moist, clear nasal discharge    Throat: moist mucus membranes, no erythema, no tonsillar exudate.  Airway is patent Cardio: regular rate and rhythm, S1S2 heard, no murmurs  appreciated Pulm: clear to auscultation bilaterally, no wheezes, rhonchi or rales; normal work of breathing on room air  Assessment/ Plan: 62 y.o. female   1. URI with cough and congestion Patient is afebrile and well-appearing.  I suspect that this is a viral process, as there is no evidence of bacterial infection on today's exam.  Will treat with supportive care, Tessalon Perles.  Home care instructions were reviewed with the patient and a handout was provided.  I did recommend that she use the albuterol she has at home should she find that she is short of breath or having wheezing.  Work note provided.  Strict return precautions and reasons for emergent evaluation in the emergency department review with patient.  She voiced understanding and will follow-up as needed.   Meds ordered this encounter  Medications  . benzonatate (TESSALON) 200 MG capsule    Sig: Take 1 capsule (200 mg total) by mouth 2 (two) times daily as needed for cough.    Dispense:  20 capsule    Refill:  [place], DO [place] [phone]

## 2017-02-27 ENCOUNTER — Other Ambulatory Visit: Payer: Self-pay | Admitting: Physician Assistant

## 2017-02-27 ENCOUNTER — Telehealth: Payer: Self-pay | Admitting: Family Medicine

## 2017-02-27 MED ORDER — PREDNISONE 10 MG (48) PO TBPK: ORAL_TABLET | ORAL | 0 refills | Status: DC

## 2017-02-27 MED ORDER — ALBUTEROL SULFATE HFA 108 (90 BASE) MCG/ACT IN AERS: 2.0000 | INHALATION_SPRAY | Freq: Four times a day (QID) | RESPIRATORY_TRACT | 0 refills | Status: DC | PRN

## 2017-02-27 MED ORDER — DOXYCYCLINE HYCLATE 100 MG PO TABS: 100.0000 mg | ORAL_TABLET | Freq: Two times a day (BID) | ORAL | 0 refills | Status: DC

## 2017-02-27 NOTE — Telephone Encounter (Signed)
What symptoms do you have? Coughing, wheezing in chest   How long have you been sick? About a month  Have you been seen for this problem? Yes Feb 15, 2017 by Dr. Darnell Level was prescribed pills for cold states they did not help  If your provider decides to give you a prescription, which pharmacy would you like for it to be sent to? [facility]   Patient informed that this information will be sent to the clinical staff for review and that they should receive a follow up call.

## 2017-02-27 NOTE — Telephone Encounter (Signed)
Patient aware.

## 2017-02-27 NOTE — Telephone Encounter (Signed)
Sent doxycycline, albuterol and prednisone

## 2017-04-12 ENCOUNTER — Encounter: Payer: Self-pay | Admitting: Physician Assistant

## 2017-04-12 ENCOUNTER — Ambulatory Visit: Payer: BLUE CROSS/BLUE SHIELD | Admitting: Physician Assistant

## 2017-04-12 VITALS — BP 138/90 | HR 88 | Temp 97.7°F | Ht 64.0 in | Wt 191.0 lb

## 2017-04-12 DIAGNOSIS — F411 Generalized anxiety disorder: Secondary | ICD-10-CM

## 2017-04-12 DIAGNOSIS — M25511 Pain in right shoulder: Secondary | ICD-10-CM | POA: Diagnosis not present

## 2017-04-12 DIAGNOSIS — M170 Bilateral primary osteoarthritis of knee: Secondary | ICD-10-CM | POA: Diagnosis not present

## 2017-04-12 DIAGNOSIS — I10 Essential (primary) hypertension: Secondary | ICD-10-CM | POA: Diagnosis not present

## 2017-04-12 DIAGNOSIS — G8929 Other chronic pain: Secondary | ICD-10-CM | POA: Diagnosis not present

## 2017-04-12 MED ORDER — DICLOFENAC SODIUM 75 MG PO TBEC: 75.0000 mg | DELAYED_RELEASE_TABLET | Freq: Two times a day (BID) | ORAL | 6 refills | Status: DC

## 2017-04-12 MED ORDER — LOSARTAN POTASSIUM-HCTZ 100-25 MG PO TABS: 1.0000 | ORAL_TABLET | Freq: Every day | ORAL | 3 refills | Status: DC

## 2017-04-12 MED ORDER — ALPRAZOLAM 1 MG PO TABS: 1.0000 mg | ORAL_TABLET | Freq: Three times a day (TID) | ORAL | 5 refills | Status: DC | PRN

## 2017-04-12 MED ORDER — TRAZODONE HCL 100 MG PO TABS: 100.0000 mg | ORAL_TABLET | Freq: Every day | ORAL | 11 refills | Status: DC

## 2017-04-12 NOTE — Patient Instructions (Signed)
In a few days you may receive a survey in the mail or online from [employer] regarding your visit with us today. Please take a moment to fill this out. Your feedback is very important to our whole office. It can help us better understand your needs as well as improve your experience and satisfaction. Thank you for taking your time to complete it. We care about you.  Andrus Sharp, PA-C  

## 2017-04-12 NOTE — Progress Notes (Signed)
BP 138/90   Pulse 88   Temp 97.7 F (36.5 C) (Oral)   Ht 5\' 4"  (1.626 m)   Wt 191 lb (86.6 kg)   BMI 32.79 kg/m    Subjective:    Patient ID: Monica Cross, female    DOB: 1956-01-19, 62 y.o.   MRN: [id number]  HPI: Monica Cross is a 62 y.o. female presenting on 04/12/2017 for Hypertension (6 month recheck); Hyperlipidemia; and Anxiety  Patient comes in for 13-month recheck on her hypertension, osteoarthritis, hyperlipidemia, anxiety, and chronic right shoulder pain.  The pain has increased over the past few months.  She even has difficulty reaching above 90 degrees in the flexion position.  She does feel crepitus in her shoulder when she moves it.  She sometimes has to use the left arm to move the right arm.  She has worked a repetitious job in [employer].  Past Medical History:  Diagnosis Date  . Depression   . Hyperlipidemia   . Hypertension    Relevant past medical, surgical, family and social history reviewed and updated as indicated. Interim medical history since our last visit reviewed. Allergies and medications reviewed and updated. DATA REVIEWED: CHART IN EPIC  Family History reviewed for pertinent findings.  Review of Systems  Constitutional: Negative.  Negative for activity change, fatigue and fever.  HENT: Negative.   Eyes: Negative.   Respiratory: Negative.  Negative for cough.   Cardiovascular: Negative.  Negative for chest pain.  Gastrointestinal: Negative.  Negative for abdominal pain.  Endocrine: Negative.   Genitourinary: Negative.  Negative for dysuria.  Musculoskeletal: Positive for arthralgias, joint swelling and myalgias.  Skin: Negative.   Neurological: Negative.     Allergies as of 04/12/2017   No Known Allergies     Medication List        Accurate as of 04/12/17  2:21 PM. Always use your most recent med list.          ALPRAZolam 1 MG tablet Commonly known as:  XANAX Take 1 tablet (1 mg total) by mouth 3 (three) times daily as  needed for anxiety.   atorvastatin 20 MG tablet Commonly known as:  LIPITOR Take 20 mg by mouth daily.   busPIRone 10 MG tablet Commonly known as:  BUSPAR   citalopram 20 MG tablet Commonly known as:  CELEXA Take 1 Tablet by mouth once daily   diclofenac 75 MG EC tablet Commonly known as:  VOLTAREN Take 1 tablet (75 mg total) by mouth 2 (two) times daily.   fenofibrate 145 MG tablet Commonly known as:  TRICOR Take 145 mg by mouth daily.   loratadine 10 MG tablet Commonly known as:  CLARITIN Take 1 tablet (10 mg total) by mouth daily.   losartan-hydrochlorothiazide 100-25 MG tablet Commonly known as:  HYZAAR Take 1 tablet by mouth daily.   SUMAtriptan 50 MG tablet Commonly known as:  IMITREX TAKE ONE TABLET BY MOUTH every TWO hours as needed FOR migraine - MAY REPEAT in TWO HOURS if headache persists OR recurs   traZODone 100 MG tablet Commonly known as:  DESYREL Take 1-2 tablets (100-200 mg total) by mouth at bedtime.          Objective:    BP 138/90   Pulse 88   Temp 97.7 F (36.5 C) (Oral)   Ht 5\' 4"  (1.626 m)   Wt 191 lb (86.6 kg)   BMI 32.79 kg/m   No Known Allergies  Wt Readings from Last 3 Encounters:  04/12/17 191 lb (86.6 kg)  02/15/17 191 lb (86.6 kg)  02/01/17 189 lb (85.7 kg)    Physical Exam  Constitutional: She is oriented to person, place, and time. She appears well-developed and well-nourished.  HENT:  Head: Normocephalic and atraumatic.  Right Ear: Tympanic membrane, external ear and ear canal normal.  Left Ear: Tympanic membrane, external ear and ear canal normal.  Nose: Nose normal. No rhinorrhea.  Mouth/Throat: Oropharynx is clear and moist and mucous membranes are normal. No oropharyngeal exudate or posterior oropharyngeal erythema.  Eyes: Conjunctivae and EOM are normal. Pupils are equal, round, and reactive to light.  Neck: Normal range of motion. Neck supple.  Cardiovascular: Normal rate, regular rhythm, normal heart sounds  and intact distal pulses.  Pulmonary/Chest: Effort normal and breath sounds normal.  Abdominal: Soft. Bowel sounds are normal.  Musculoskeletal:       Right shoulder: She exhibits decreased range of motion, tenderness, crepitus, pain and spasm.       Arms: Neurological: She is alert and oriented to person, place, and time. She has normal reflexes.  Skin: Skin is warm and dry. No rash noted.  Psychiatric: She has a normal mood and affect. Her behavior is normal. Judgment and thought content normal.    She should be getting into the body aches cough and she is    Assessment & Plan:   1. Chronic right shoulder pain - Ambulatory referral to Orthopedic Surgery  2. Generalized anxiety disorder - busPIRone (BUSPAR) 10 MG tablet - ALPRAZolam (XANAX) 1 MG tablet; Take 1 tablet (1 mg total) by mouth 3 (three) times daily as needed for anxiety.  Dispense: 90 tablet; Refill: 5  3. Primary osteoarthritis of both knees - diclofenac (VOLTAREN) 75 MG EC tablet; Take 1 tablet (75 mg total) by mouth 2 (two) times daily.  Dispense: 60 tablet; Refill: 6  4. Essential hypertension - losartan-hydrochlorothiazide (HYZAAR) 100-25 MG tablet; Take 1 tablet by mouth daily.  Dispense: 90 tablet; Refill: 3   Continue all other maintenance medications as listed above.  Follow up plan: Return in about 6 months (around 10/13/2017) for recheck.  Educational handout given for [place] PA-C [place] [address]  [place], [place] [address] [phone]   04/12/2017, 2:21 PM

## 2017-04-24 DIAGNOSIS — M7541 Impingement syndrome of right shoulder: Secondary | ICD-10-CM | POA: Diagnosis not present

## 2017-05-03 ENCOUNTER — Ambulatory Visit: Payer: BLUE CROSS/BLUE SHIELD | Admitting: Physician Assistant

## 2017-05-17 ENCOUNTER — Other Ambulatory Visit: Payer: Self-pay | Admitting: Physician Assistant

## 2017-05-17 ENCOUNTER — Telehealth: Payer: Self-pay | Admitting: Physician Assistant

## 2017-05-17 MED ORDER — TELMISARTAN-HCTZ 80-25 MG PO TABS: 1.0000 | ORAL_TABLET | Freq: Every day | ORAL | 1 refills | Status: DC

## 2017-05-17 NOTE — Telephone Encounter (Signed)
Patient aware of medication change. 

## 2017-05-17 NOTE — Telephone Encounter (Signed)
Sent micardis HCTZ to [facility]

## 2017-05-20 DIAGNOSIS — E559 Vitamin D deficiency, unspecified: Secondary | ICD-10-CM | POA: Diagnosis not present

## 2017-05-20 DIAGNOSIS — Z79899 Other long term (current) drug therapy: Secondary | ICD-10-CM | POA: Diagnosis not present

## 2017-05-20 DIAGNOSIS — I10 Essential (primary) hypertension: Secondary | ICD-10-CM | POA: Diagnosis not present

## 2017-05-20 DIAGNOSIS — E782 Mixed hyperlipidemia: Secondary | ICD-10-CM | POA: Diagnosis not present

## 2017-05-20 DIAGNOSIS — Z139 Encounter for screening, unspecified: Secondary | ICD-10-CM | POA: Diagnosis not present

## 2017-05-20 DIAGNOSIS — R7301 Impaired fasting glucose: Secondary | ICD-10-CM | POA: Diagnosis not present

## 2017-05-29 DIAGNOSIS — R7301 Impaired fasting glucose: Secondary | ICD-10-CM | POA: Diagnosis not present

## 2017-05-29 DIAGNOSIS — E782 Mixed hyperlipidemia: Secondary | ICD-10-CM | POA: Diagnosis not present

## 2017-05-29 DIAGNOSIS — E559 Vitamin D deficiency, unspecified: Secondary | ICD-10-CM | POA: Diagnosis not present

## 2017-05-29 DIAGNOSIS — I10 Essential (primary) hypertension: Secondary | ICD-10-CM | POA: Diagnosis not present

## 2017-06-12 ENCOUNTER — Encounter: Payer: Self-pay | Admitting: Physician Assistant

## 2017-06-12 ENCOUNTER — Other Ambulatory Visit: Payer: Self-pay | Admitting: Physician Assistant

## 2017-06-12 DIAGNOSIS — F411 Generalized anxiety disorder: Secondary | ICD-10-CM

## 2017-06-13 MED ORDER — CITALOPRAM HYDROBROMIDE 20 MG PO TABS: 20.0000 mg | ORAL_TABLET | Freq: Every day | ORAL | 0 refills | Status: DC

## 2017-06-19 ENCOUNTER — Telehealth: Payer: Self-pay | Admitting: Physician Assistant

## 2017-06-21 ENCOUNTER — Encounter: Payer: Self-pay | Admitting: Physician Assistant

## 2017-06-21 ENCOUNTER — Ambulatory Visit: Payer: BLUE CROSS/BLUE SHIELD | Admitting: Physician Assistant

## 2017-06-21 VITALS — BP 157/80 | HR 63 | Temp 96.9°F | Ht 64.0 in | Wt 186.0 lb

## 2017-06-21 DIAGNOSIS — G629 Polyneuropathy, unspecified: Secondary | ICD-10-CM | POA: Diagnosis not present

## 2017-06-21 DIAGNOSIS — R6889 Other general symptoms and signs: Secondary | ICD-10-CM | POA: Diagnosis not present

## 2017-06-22 LAB — VITAMIN B12: Vitamin B-12: 1031 pg/mL (ref 232–1245)

## 2017-06-24 ENCOUNTER — Other Ambulatory Visit: Payer: Self-pay | Admitting: Physician Assistant

## 2017-06-24 DIAGNOSIS — M25511 Pain in right shoulder: Principal | ICD-10-CM

## 2017-06-24 DIAGNOSIS — G8929 Other chronic pain: Secondary | ICD-10-CM

## 2017-06-24 NOTE — Progress Notes (Signed)
BP (!) 157/80   Pulse 63   Temp (!) 96.9 F (36.1 C) (Oral)   Ht 5\' 4"  (1.626 m)   Wt 186 lb (84.4 kg)   BMI 31.93 kg/m    Subjective:    Patient ID: Monica Cross, female    DOB: 1955/06/18, 62 y.o.   MRN: [id number]  HPI: Monica Cross is a 62 y.o. female presenting on 06/21/2017 for tingling in finger  This patient comes in with ongoing tingling in the hands.  She has had a prior history of carpal tunnel syndrome that was surgically repaired.  This tingling is worse whenever she is using her hand out front and above the shoulder level.  She has never been evaluated for cervical issues.  She has had a bad shoulder at times.  Her right shoulder is quite painful at times.  She has decreased range of motion in it.  She has not taken any medication for this.  She does not know she is ever had B12 deficiency.  Past Medical History:  Diagnosis Date  . Depression   . Hyperlipidemia   . Hypertension    Relevant past medical, surgical, family and social history reviewed and updated as indicated. Interim medical history since our last visit reviewed. Allergies and medications reviewed and updated. DATA REVIEWED: CHART IN EPIC  Family History reviewed for pertinent findings.  Review of Systems  Constitutional: Negative.  Negative for activity change, fatigue and fever.  HENT: Negative.   Eyes: Negative.   Respiratory: Negative.  Negative for cough.   Cardiovascular: Negative.  Negative for chest pain.  Gastrointestinal: Negative.  Negative for abdominal pain.  Endocrine: Negative.   Genitourinary: Negative.  Negative for dysuria.  Musculoskeletal: Negative.   Skin: Negative.   Neurological: Negative.     Allergies as of 06/21/2017   No Known Allergies     Medication List        Accurate as of 06/21/17 11:59 PM. Always use your most recent med list.          ALPRAZolam 1 MG tablet Commonly known as:  XANAX Take 1 tablet (1 mg total) by mouth 3 (three) times daily as  needed for anxiety.   atorvastatin 20 MG tablet Commonly known as:  LIPITOR Take 20 mg by mouth daily.   busPIRone 10 MG tablet Commonly known as:  BUSPAR   citalopram 20 MG tablet Commonly known as:  CELEXA Take 1 tablet (20 mg total) by mouth daily.   diclofenac 75 MG EC tablet Commonly known as:  VOLTAREN Take 1 tablet (75 mg total) by mouth 2 (two) times daily.   fenofibrate 145 MG tablet Commonly known as:  TRICOR Take 145 mg by mouth daily.   loratadine 10 MG tablet Commonly known as:  CLARITIN Take 1 tablet (10 mg total) by mouth daily.   SUMAtriptan 50 MG tablet Commonly known as:  IMITREX TAKE ONE TABLET BY MOUTH every TWO hours as needed FOR migraine - MAY REPEAT in TWO HOURS if headache persists OR recurs   telmisartan-hydrochlorothiazide 80-25 MG tablet Commonly known as:  MICARDIS HCT Take 1 tablet by mouth daily.   traZODone 100 MG tablet Commonly known as:  DESYREL Take 1-2 tablets (100-200 mg total) by mouth at bedtime.          Objective:    BP (!) 157/80   Pulse 63   Temp (!) 96.9 F (36.1 C) (Oral)   Ht 5\' 4"  (1.626 m)  Wt 186 lb (84.4 kg)   BMI 31.93 kg/m   No Known Allergies  Wt Readings from Last 3 Encounters:  06/21/17 186 lb (84.4 kg)  04/12/17 191 lb (86.6 kg)  02/15/17 191 lb (86.6 kg)    Physical Exam  Constitutional: She is oriented to person, place, and time. She appears well-developed and well-nourished.  HENT:  Head: Normocephalic and atraumatic.  Eyes: Pupils are equal, round, and reactive to light. Conjunctivae and EOM are normal.  Cardiovascular: Normal rate, regular rhythm, normal heart sounds and intact distal pulses.  Pulmonary/Chest: Effort normal and breath sounds normal.  Abdominal: Soft. Bowel sounds are normal.  Neurological: She is alert and oriented to person, place, and time. She has normal strength. No sensory deficit.  Reflex Scores:      Bicep reflexes are 0 on the right side and 0 on the left  side. Skin: Skin is warm and dry. No rash noted.  Psychiatric: She has a normal mood and affect. Her behavior is normal. Judgment and thought content normal.    Results for orders placed or performed in visit on 06/21/17  Vitamin B12  Result Value Ref Range   Vitamin B-12 1,031 232 - 1,245 pg/mL      Assessment & Plan:   1. Neuropathy - Vitamin B12   Continue all other maintenance medications as listed above.  Follow up plan: No follow-ups on file.  Educational handout given for Kaufman PA-C [place] [address]  [place], [place] [address] [phone]   06/24/2017, 10:10 AM

## 2017-07-01 DIAGNOSIS — D696 Thrombocytopenia, unspecified: Secondary | ICD-10-CM | POA: Diagnosis not present

## 2017-07-01 DIAGNOSIS — R300 Dysuria: Secondary | ICD-10-CM | POA: Diagnosis not present

## 2017-07-03 DIAGNOSIS — M7541 Impingement syndrome of right shoulder: Secondary | ICD-10-CM | POA: Insufficient documentation

## 2017-07-11 ENCOUNTER — Ambulatory Visit: Payer: BLUE CROSS/BLUE SHIELD | Admitting: Physician Assistant

## 2017-07-11 ENCOUNTER — Encounter: Payer: Self-pay | Admitting: Physician Assistant

## 2017-07-11 VITALS — BP 127/81 | HR 82 | Temp 99.4°F | Ht 64.0 in | Wt 184.8 lb

## 2017-07-11 DIAGNOSIS — Z1211 Encounter for screening for malignant neoplasm of colon: Secondary | ICD-10-CM | POA: Diagnosis not present

## 2017-07-11 DIAGNOSIS — Z1159 Encounter for screening for other viral diseases: Secondary | ICD-10-CM | POA: Diagnosis not present

## 2017-07-11 DIAGNOSIS — K921 Melena: Secondary | ICD-10-CM

## 2017-07-11 DIAGNOSIS — D649 Anemia, unspecified: Secondary | ICD-10-CM | POA: Diagnosis not present

## 2017-07-12 LAB — CBC WITH DIFFERENTIAL/PLATELET
Basophils Absolute: 0 10*3/uL (ref 0.0–0.2)
Basos: 1 %
EOS (ABSOLUTE): 0.1 10*3/uL (ref 0.0–0.4)
Eos: 1 %
Hematocrit: 37.6 % (ref 34.0–46.6)
Hemoglobin: 11.8 g/dL (ref 11.1–15.9)
Immature Grans (Abs): 0 10*3/uL (ref 0.0–0.1)
Immature Granulocytes: 0 %
Lymphocytes Absolute: 2.4 10*3/uL (ref 0.7–3.1)
Lymphs: 27 %
MCH: 29.9 pg (ref 26.6–33.0)
MCHC: 31.4 g/dL — ABNORMAL LOW (ref 31.5–35.7)
MCV: 95 fL (ref 79–97)
Monocytes Absolute: 0.4 10*3/uL (ref 0.1–0.9)
Monocytes: 5 %
Neutrophils Absolute: 5.7 10*3/uL (ref 1.4–7.0)
Neutrophils: 66 %
Platelets: 487 10*3/uL — ABNORMAL HIGH (ref 150–450)
RBC: 3.94 x10E6/uL (ref 3.77–5.28)
RDW: 13.5 % (ref 12.3–15.4)
WBC: 8.7 10*3/uL (ref 3.4–10.8)

## 2017-07-12 LAB — IRON: Iron: 55 ug/dL (ref 27–159)

## 2017-07-12 LAB — HEPATITIS C ANTIBODY: Hep C Virus Ab: <0.1 s/co ratio (ref 0.0–0.9)

## 2017-07-15 NOTE — Progress Notes (Signed)
BP 127/81   Pulse 82   Temp 99.4 F (37.4 C) (Oral)   Ht 5\' 4"  (1.626 m)   Wt 184 lb 12.8 oz (83.8 kg)   BMI 31.72 kg/m    Subjective:    Patient ID: Monica Cross, female    DOB: 01/24/1956, 62 y.o.   MRN: [id number]  HPI: Monica Cross is a 62 y.o. female presenting on 07/11/2017 for Abdominal Pain and hemoglobin low (had labs at work and was told that hgb was low. Patient does not remember number)   This patient comes in for abdominal pain.  While she was at work the reported a low hemoglobin.  She has never had this before we will draw labs today.  She still has all of her abdominal organs.  She denies any nausea fatty foods she denies any changes in her bowel or bladder function.  A few weeks ago she reports that she did have black stools for just a couple of days but then it returned to normal.  She denied any medications like Pepto-Bismol being taken.  Past Medical History:  Diagnosis Date  . Depression   . Hyperlipidemia   . Hypertension    Relevant past medical, surgical, family and social history reviewed and updated as indicated. Interim medical history since our last visit reviewed. Allergies and medications reviewed and updated. DATA REVIEWED: CHART IN EPIC  Family History reviewed for pertinent findings.  Review of Systems  Constitutional: Negative.  Negative for activity change, fatigue and fever.  HENT: Negative.   Eyes: Negative.   Respiratory: Negative.  Negative for cough.   Cardiovascular: Negative.  Negative for chest pain.  Gastrointestinal: Positive for abdominal pain. Negative for blood in stool, constipation, diarrhea, nausea, rectal pain and vomiting.  Endocrine: Negative.   Genitourinary: Negative.  Negative for dysuria.  Musculoskeletal: Negative.   Skin: Negative.   Neurological: Negative.     Allergies as of 07/11/2017   No Known Allergies     Medication List        Accurate as of 07/11/17 11:59 PM. Always use your most recent med  list.          ALPRAZolam 1 MG tablet Commonly known as:  XANAX Take 1 tablet (1 mg total) by mouth 3 (three) times daily as needed for anxiety.   atorvastatin 20 MG tablet Commonly known as:  LIPITOR Take 20 mg by mouth daily.   busPIRone 10 MG tablet Commonly known as:  BUSPAR   citalopram 20 MG tablet Commonly known as:  CELEXA Take 1 tablet (20 mg total) by mouth daily.   diclofenac 75 MG EC tablet Commonly known as:  VOLTAREN Take 1 tablet (75 mg total) by mouth 2 (two) times daily.   fenofibrate 145 MG tablet Commonly known as:  TRICOR Take 145 mg by mouth daily.   loratadine 10 MG tablet Commonly known as:  CLARITIN Take 1 tablet (10 mg total) by mouth daily.   SUMAtriptan 50 MG tablet Commonly known as:  IMITREX TAKE ONE TABLET BY MOUTH every TWO hours as needed FOR migraine - MAY REPEAT in TWO HOURS if headache persists OR recurs   telmisartan-hydrochlorothiazide 80-25 MG tablet Commonly known as:  MICARDIS HCT Take 1 tablet by mouth daily.   traZODone 100 MG tablet Commonly known as:  DESYREL Take 1-2 tablets (100-200 mg total) by mouth at bedtime.          Objective:    BP 127/81   Pulse  82   Temp 99.4 F (37.4 C) (Oral)   Ht 5\' 4"  (1.626 m)   Wt 184 lb 12.8 oz (83.8 kg)   BMI 31.72 kg/m   No Known Allergies  Wt Readings from Last 3 Encounters:  07/11/17 184 lb 12.8 oz (83.8 kg)  06/21/17 186 lb (84.4 kg)  04/12/17 191 lb (86.6 kg)    Physical Exam  Constitutional: She is oriented to person, place, and time. She appears well-developed and well-nourished.  HENT:  Head: Normocephalic and atraumatic.  Right Ear: Tympanic membrane, external ear and ear canal normal.  Left Ear: Tympanic membrane, external ear and ear canal normal.  Nose: Nose normal. No rhinorrhea.  Mouth/Throat: Oropharynx is clear and moist and mucous membranes are normal. No oropharyngeal exudate or posterior oropharyngeal erythema.  Eyes: Pupils are equal, round, and  reactive to light. Conjunctivae and EOM are normal.  Neck: Normal range of motion. Neck supple.  Cardiovascular: Normal rate, regular rhythm, normal heart sounds and intact distal pulses.  Pulmonary/Chest: Effort normal and breath sounds normal.  Abdominal: Soft. Bowel sounds are normal.  Neurological: She is alert and oriented to person, place, and time. She has normal reflexes.  Skin: Skin is warm and dry. No rash noted.  Psychiatric: She has a normal mood and affect. Her behavior is normal. Judgment and thought content normal.    Results for orders placed or performed in visit on 07/11/17  CBC with Differential/Platelet  Result Value Ref Range   WBC 8.7 3.4 - 10.8 x10E3/uL   RBC 3.94 3.77 - 5.28 x10E6/uL   Hemoglobin 11.8 11.1 - 15.9 g/dL   Hematocrit 37.6 34.0 - 46.6 %   MCV 95 79 - 97 fL   MCH 29.9 26.6 - 33.0 pg   MCHC 31.4 (L) 31.5 - 35.7 g/dL   RDW 13.5 12.3 - 15.4 %   Platelets 487 (H) 150 - 450 x10E3/uL   Neutrophils 66 Not Estab. %   Lymphs 27 Not Estab. %   Monocytes 5 Not Estab. %   Eos 1 Not Estab. %   Basos 1 Not Estab. %   Neutrophils Absolute 5.7 1.4 - 7.0 x10E3/uL   Lymphocytes Absolute 2.4 0.7 - 3.1 x10E3/uL   Monocytes Absolute 0.4 0.1 - 0.9 x10E3/uL   EOS (ABSOLUTE) 0.1 0.0 - 0.4 x10E3/uL   Basophils Absolute 0.0 0.0 - 0.2 x10E3/uL   Immature Granulocytes 0 Not Estab. %   Immature Grans (Abs) 0.0 0.0 - 0.1 x10E3/uL  Iron  Result Value Ref Range   Iron 55 27 - 159 ug/dL  Hepatitis C antibody  Result Value Ref Range   Hep C Virus Ab <0.1 0.0 - 0.9 s/co ratio      Assessment & Plan:   1. Anemia, unspecified type - CBC with Differential/Platelet - Iron  2. Encounter for hepatitis C screening test for low risk patient - Hepatitis C antibody  3. Melena - Ambulatory referral to Gastroenterology  4. Screening for colon cancer - Ambulatory referral to Gastroenterology   Continue all other maintenance medications as listed above.  Follow up  plan: No follow-ups on file.  Educational handout given for Purcell PA-C [place] [address]  [place], [place] [address] [phone]   07/15/2017, 9:32 AM

## 2017-07-26 DIAGNOSIS — H40033 Anatomical narrow angle, bilateral: Secondary | ICD-10-CM | POA: Diagnosis not present

## 2017-07-26 DIAGNOSIS — H04123 Dry eye syndrome of bilateral lacrimal glands: Secondary | ICD-10-CM | POA: Diagnosis not present

## 2017-08-02 ENCOUNTER — Telehealth: Payer: Self-pay | Admitting: Physician Assistant

## 2017-08-05 NOTE — Telephone Encounter (Signed)
The patient was referred to orthopedics for chronic right shoulder pain.  If they feel that it is something from the cervical spine we will need to order an MRI of the cervical spine and then make the referral to neurosurgery.

## 2017-08-07 ENCOUNTER — Encounter: Payer: Self-pay | Admitting: Physician Assistant

## 2017-08-08 ENCOUNTER — Other Ambulatory Visit: Payer: Self-pay | Admitting: Physician Assistant

## 2017-08-08 DIAGNOSIS — G629 Polyneuropathy, unspecified: Secondary | ICD-10-CM

## 2017-08-13 ENCOUNTER — Ambulatory Visit: Payer: BLUE CROSS/BLUE SHIELD | Admitting: Neurology

## 2017-08-13 NOTE — Telephone Encounter (Signed)
Phone call taken care of in different encounter.  This encounter will now be closed  

## 2017-08-22 ENCOUNTER — Other Ambulatory Visit: Payer: Self-pay

## 2017-08-22 ENCOUNTER — Ambulatory Visit: Payer: BLUE CROSS/BLUE SHIELD | Admitting: Neurology

## 2017-08-22 ENCOUNTER — Encounter: Payer: Self-pay | Admitting: Neurology

## 2017-08-22 VITALS — BP 166/89 | HR 74 | Wt 186.0 lb

## 2017-08-22 DIAGNOSIS — G5603 Carpal tunnel syndrome, bilateral upper limbs: Secondary | ICD-10-CM

## 2017-08-22 MED ORDER — GABAPENTIN 100 MG PO CAPS: 100.0000 mg | ORAL_CAPSULE | Freq: Three times a day (TID) | ORAL | 11 refills | Status: DC
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
## 2017-09-05 ENCOUNTER — Other Ambulatory Visit: Payer: Self-pay | Admitting: Physician Assistant

## 2017-09-05 DIAGNOSIS — F411 Generalized anxiety disorder: Secondary | ICD-10-CM

## 2017-09-05 MED ORDER — CITALOPRAM HYDROBROMIDE 20 MG PO TABS: 20.0000 mg | ORAL_TABLET | Freq: Every day | ORAL | 0 refills | Status: DC

## 2017-09-09 DIAGNOSIS — I10 Essential (primary) hypertension: Secondary | ICD-10-CM | POA: Diagnosis not present

## 2017-09-09 DIAGNOSIS — Z008 Encounter for other general examination: Secondary | ICD-10-CM | POA: Diagnosis not present

## 2017-09-09 DIAGNOSIS — H1031 Unspecified acute conjunctivitis, right eye: Secondary | ICD-10-CM | POA: Diagnosis not present

## 2017-09-09 DIAGNOSIS — Z719 Counseling, unspecified: Secondary | ICD-10-CM | POA: Diagnosis not present

## 2017-09-09 DIAGNOSIS — E782 Mixed hyperlipidemia: Secondary | ICD-10-CM | POA: Diagnosis not present

## 2017-09-13 ENCOUNTER — Encounter: Payer: BLUE CROSS/BLUE SHIELD | Admitting: Neurology

## 2017-09-20 ENCOUNTER — Other Ambulatory Visit: Payer: Self-pay | Admitting: Physician Assistant

## 2017-09-20 MED ORDER — TELMISARTAN-HCTZ 80-25 MG PO TABS: 1.0000 | ORAL_TABLET | Freq: Every day | ORAL | 1 refills | Status: DC

## 2017-09-20 NOTE — Telephone Encounter (Signed)
Patient aware that rx sent to pharmacy. 

## 2017-09-27 ENCOUNTER — Encounter: Payer: BLUE CROSS/BLUE SHIELD | Admitting: Neurology

## 2017-09-30 ENCOUNTER — Encounter: Payer: Self-pay | Admitting: Neurology

## 2017-10-04 ENCOUNTER — Encounter: Payer: Self-pay | Admitting: Physician Assistant

## 2017-10-05 ENCOUNTER — Encounter: Payer: Self-pay | Admitting: Physician Assistant

## 2017-10-15 ENCOUNTER — Ambulatory Visit: Payer: BLUE CROSS/BLUE SHIELD | Admitting: Physician Assistant

## 2017-10-21 DIAGNOSIS — E559 Vitamin D deficiency, unspecified: Secondary | ICD-10-CM | POA: Diagnosis not present

## 2017-10-21 DIAGNOSIS — R7301 Impaired fasting glucose: Secondary | ICD-10-CM | POA: Diagnosis not present

## 2017-10-21 DIAGNOSIS — Z139 Encounter for screening, unspecified: Secondary | ICD-10-CM | POA: Diagnosis not present

## 2017-10-21 DIAGNOSIS — Z79899 Other long term (current) drug therapy: Secondary | ICD-10-CM | POA: Diagnosis not present

## 2017-10-21 DIAGNOSIS — E782 Mixed hyperlipidemia: Secondary | ICD-10-CM | POA: Diagnosis not present

## 2017-10-21 DIAGNOSIS — Z0130 Encounter for examination of blood pressure without abnormal findings: Secondary | ICD-10-CM | POA: Diagnosis not present

## 2017-11-01 ENCOUNTER — Encounter: Payer: Self-pay | Admitting: Physician Assistant

## 2017-11-01 ENCOUNTER — Ambulatory Visit: Payer: BLUE CROSS/BLUE SHIELD | Admitting: Physician Assistant

## 2017-11-01 VITALS — BP 162/94 | HR 77 | Temp 98.2°F | Ht 64.0 in | Wt 187.6 lb

## 2017-11-01 DIAGNOSIS — M170 Bilateral primary osteoarthritis of knee: Secondary | ICD-10-CM | POA: Diagnosis not present

## 2017-11-01 DIAGNOSIS — F411 Generalized anxiety disorder: Secondary | ICD-10-CM

## 2017-11-01 DIAGNOSIS — M5136 Other intervertebral disc degeneration, lumbar region: Secondary | ICD-10-CM

## 2017-11-01 MED ORDER — ALPRAZOLAM 1 MG PO TABS: 1.0000 mg | ORAL_TABLET | Freq: Three times a day (TID) | ORAL | 5 refills | Status: DC | PRN

## 2017-11-01 MED ORDER — TELMISARTAN-HCTZ 80-25 MG PO TABS: 1.0000 | ORAL_TABLET | Freq: Every day | ORAL | 1 refills | Status: DC

## 2017-11-01 MED ORDER — CYCLOBENZAPRINE HCL 10 MG PO TABS: 10.0000 mg | ORAL_TABLET | Freq: Three times a day (TID) | ORAL | 1 refills | Status: DC | PRN

## 2017-11-01 MED ORDER — CITALOPRAM HYDROBROMIDE 40 MG PO TABS: 40.0000 mg | ORAL_TABLET | Freq: Every day | ORAL | 3 refills | Status: DC

## 2017-11-01 MED ORDER — METHYLPREDNISOLONE ACETATE 80 MG/ML IJ SUSP
80.0000 mg | Freq: Once | INTRAMUSCULAR | Status: AC
  Administered 2017-11-01: 80 mg via INTRAMUSCULAR

## 2017-11-01 NOTE — Patient Instructions (Signed)

## 2017-11-04 NOTE — Progress Notes (Signed)
Any suspicious activity on [facility] Csrs: No    BP (!) 162/94   Pulse 77   Temp 98.2 F (36.8 C) (Oral)   Ht 5\' 4"  (1.626 m)   Wt 187 lb 9.6 oz (85.1 kg)   BMI 32.20 kg/m    Subjective:    Patient ID: Monica Cross, female    DOB: 07-Dec-1955, 62 y.o.   MRN: [id number]  HPI: Monica Cross is a 62 y.o. female presenting on 11/01/2017 for Hypertension (3 month follow up ) and Hyperlipidemia  Patient comes in for 15-month recheck on her chronic conditions.  She has known degenerative disc disease and had a nerve impingement.  She also has osteoarthritis to many areas of her back.  She did have labs performed at work and will get [place] the results.  She is still using her diclofenac twice daily for inflammation.  She does need her pain medication refill.  She also has anxiety and she does use Xanax on a regular basis.  We will update her refill today.  She is also updated on her narcotic contract.  This patient returns for a 6 month recheck on narcotic use for anxi isety and medication refills  Patient currently taking alprazolam Behavior-normal Medication side effects-none Any concerns-none  [place] CSRS reviewed: Yes  Past Medical History:  Diagnosis Date  . Carpal tunnel syndrome    Bilat  . Depression   . Headache   . Hyperlipidemia   . Hypertension   . Osteoporosis   . Vision abnormalities    Relevant past medical, surgical, family and social history reviewed and updated as indicated. Interim medical history since our last visit reviewed. Allergies and medications reviewed and updated. DATA REVIEWED: CHART IN EPIC  Family History reviewed for pertinent findings.  Review of Systems  Constitutional: Negative.  Negative for activity change, fatigue and fever.  HENT: Negative.   Eyes: Negative.   Respiratory: Negative.  Negative for cough.   Cardiovascular: Negative.  Negative for chest pain.  Gastrointestinal: Negative.  Negative for abdominal pain.  Endocrine: Negative.     Genitourinary: Negative.  Negative for dysuria.  Musculoskeletal: Negative.   Skin: Negative.   Neurological: Negative.     Allergies as of 11/01/2017   No Known Allergies     Medication List        Accurate as of 11/01/17 11:59 PM. Always use your most recent med list.          ALPRAZolam 1 MG tablet Commonly known as:  XANAX Take 1 tablet (1 mg total) by mouth 3 (three) times daily as needed for anxiety.   atorvastatin 20 MG tablet Commonly known as:  LIPITOR Take 20 mg by mouth daily.   citalopram 40 MG tablet Commonly known as:  CELEXA Take 1 tablet (40 mg total) by mouth daily.   cyclobenzaprine 10 MG tablet Commonly known as:  FLEXERIL Take 1 tablet (10 mg total) by mouth 3 (three) times daily as needed for muscle spasms.   diclofenac 75 MG EC tablet Commonly known as:  VOLTAREN Take 1 tablet (75 mg total) by mouth 2 (two) times daily.   fenofibrate 145 MG tablet Commonly known as:  TRICOR Take 145 mg by mouth daily.   SUMAtriptan 50 MG tablet Commonly known as:  IMITREX TAKE ONE TABLET BY MOUTH every TWO hours as needed FOR migraine - MAY REPEAT in TWO HOURS if headache persists OR recurs   telmisartan-hydrochlorothiazide 80-25 MG tablet Commonly known as:  MICARDIS HCT Take  1 tablet by mouth daily.   traZODone 100 MG tablet Commonly known as:  DESYREL Take 1-2 tablets (100-200 mg total) by mouth at bedtime.          Objective:    BP (!) 162/94   Pulse 77   Temp 98.2 F (36.8 C) (Oral)   Ht 5\' 4"  (1.626 m)   Wt 187 lb 9.6 oz (85.1 kg)   BMI 32.20 kg/m   No Known Allergies  Wt Readings from Last 3 Encounters:  11/01/17 187 lb 9.6 oz (85.1 kg)  08/22/17 186 lb (84.4 kg)  07/11/17 184 lb 12.8 oz (83.8 kg)    Physical Exam  Constitutional: She is oriented to person, place, and time. She appears well-developed and well-nourished.  HENT:  Head: Normocephalic and atraumatic.  Eyes: Pupils are equal, round, and reactive to light.  Conjunctivae and EOM are normal.  Cardiovascular: Normal rate, regular rhythm, normal heart sounds and intact distal pulses.  Pulmonary/Chest: Effort normal and breath sounds normal.  Abdominal: Soft. Bowel sounds are normal.  Neurological: She is alert and oriented to person, place, and time. She has normal reflexes.  Skin: Skin is warm and dry. No rash noted.  Psychiatric: She has a normal mood and affect. Her behavior is normal. Judgment and thought content normal.    Results for orders placed or performed in visit on 07/11/17  CBC with Differential/Platelet  Result Value Ref Range   WBC 8.7 3.4 - 10.8 x10E3/uL   RBC 3.94 3.77 - 5.28 x10E6/uL   Hemoglobin 11.8 11.1 - 15.9 g/dL   Hematocrit 37.6 34.0 - 46.6 %   MCV 95 79 - 97 fL   MCH 29.9 26.6 - 33.0 pg   MCHC 31.4 (L) 31.5 - 35.7 g/dL   RDW 13.5 12.3 - 15.4 %   Platelets 487 (H) 150 - 450 x10E3/uL   Neutrophils 66 Not Estab. %   Lymphs 27 Not Estab. %   Monocytes 5 Not Estab. %   Eos 1 Not Estab. %   Basos 1 Not Estab. %   Neutrophils Absolute 5.7 1.4 - 7.0 x10E3/uL   Lymphocytes Absolute 2.4 0.7 - 3.1 x10E3/uL   Monocytes Absolute 0.4 0.1 - 0.9 x10E3/uL   EOS (ABSOLUTE) 0.1 0.0 - 0.4 x10E3/uL   Basophils Absolute 0.0 0.0 - 0.2 x10E3/uL   Immature Granulocytes 0 Not Estab. %   Immature Grans (Abs) 0.0 0.0 - 0.1 x10E3/uL  Iron  Result Value Ref Range   Iron 55 27 - 159 ug/dL  Hepatitis C antibody  Result Value Ref Range   Hep C Virus Ab <0.1 0.0 - 0.9 s/co ratio      Assessment & Plan:   1. Generalized anxiety disorder - ALPRAZolam (XANAX) 1 MG tablet; Take 1 tablet (1 mg total) by mouth 3 (three) times daily as needed for anxiety.  Dispense: 90 tablet; Refill: 5 - methylPREDNISolone acetate (DEPO-MEDROL) injection 80 mg - citalopram (CELEXA) 40 MG tablet; Take 1 tablet (40 mg total) by mouth daily.  Dispense: 90 tablet; Refill: 3  2. DDD (degenerative disc disease), lumbar - cyclobenzaprine (FLEXERIL) 10 MG  tablet; Take 1 tablet (10 mg total) by mouth 3 (three) times daily as needed for muscle spasms.  Dispense: 60 tablet; Refill: 1  3. Primary osteoarthritis of both knees   Continue all other maintenance medications as listed above.  Follow up plan: Return in about 6 months (around 05/02/2018).  Educational handout given for survey.  His  [employer].  Lanier Prude [place] [address]  [place], [place] [address] [phone]   11/04/2017, 10:15 PM

## 2017-11-06 DIAGNOSIS — Z1239 Encounter for other screening for malignant neoplasm of breast: Secondary | ICD-10-CM | POA: Diagnosis not present

## 2017-11-06 DIAGNOSIS — N6489 Other specified disorders of breast: Secondary | ICD-10-CM | POA: Diagnosis not present

## 2017-11-06 DIAGNOSIS — Z87828 Personal history of other (healed) physical injury and trauma: Secondary | ICD-10-CM | POA: Diagnosis not present

## 2017-11-18 DIAGNOSIS — I10 Essential (primary) hypertension: Secondary | ICD-10-CM | POA: Diagnosis not present

## 2017-11-18 DIAGNOSIS — R7301 Impaired fasting glucose: Secondary | ICD-10-CM | POA: Diagnosis not present

## 2017-11-18 DIAGNOSIS — E782 Mixed hyperlipidemia: Secondary | ICD-10-CM | POA: Diagnosis not present

## 2017-11-18 DIAGNOSIS — E559 Vitamin D deficiency, unspecified: Secondary | ICD-10-CM | POA: Diagnosis not present

## 2017-12-28 ENCOUNTER — Other Ambulatory Visit: Payer: Self-pay | Admitting: Physician Assistant

## 2017-12-28 DIAGNOSIS — M5136 Other intervertebral disc degeneration, lumbar region: Secondary | ICD-10-CM

## 2017-12-30 DIAGNOSIS — I10 Essential (primary) hypertension: Secondary | ICD-10-CM | POA: Diagnosis not present

## 2017-12-30 DIAGNOSIS — R899 Unspecified abnormal finding in specimens from other organs, systems and tissues: Secondary | ICD-10-CM | POA: Diagnosis not present

## 2017-12-30 DIAGNOSIS — E782 Mixed hyperlipidemia: Secondary | ICD-10-CM | POA: Diagnosis not present

## 2018-01-02 ENCOUNTER — Ambulatory Visit (INDEPENDENT_AMBULATORY_CARE_PROVIDER_SITE_OTHER): Payer: BLUE CROSS/BLUE SHIELD

## 2018-01-02 DIAGNOSIS — Z23 Encounter for immunization: Secondary | ICD-10-CM | POA: Diagnosis not present

## 2018-01-15 ENCOUNTER — Encounter: Payer: Self-pay | Admitting: Physician Assistant

## 2018-01-16 ENCOUNTER — Other Ambulatory Visit: Payer: Self-pay | Admitting: Physician Assistant

## 2018-01-16 MED ORDER — HYDROCODONE-HOMATROPINE 5-1.5 MG/5ML PO SYRP: 5.0000 mL | ORAL_SOLUTION | Freq: Three times a day (TID) | ORAL | 0 refills | Status: DC | PRN

## 2018-01-22 DIAGNOSIS — D649 Anemia, unspecified: Secondary | ICD-10-CM | POA: Diagnosis not present

## 2018-01-28 ENCOUNTER — Other Ambulatory Visit: Payer: Self-pay | Admitting: Physician Assistant

## 2018-01-28 ENCOUNTER — Encounter: Payer: Self-pay | Admitting: Physician Assistant

## 2018-01-28 MED ORDER — HYDROCODONE-HOMATROPINE 5-1.5 MG/5ML PO SYRP: 5.0000 mL | ORAL_SOLUTION | Freq: Three times a day (TID) | ORAL | 0 refills | Status: DC | PRN

## 2018-01-29 ENCOUNTER — Other Ambulatory Visit: Payer: Self-pay | Admitting: Physician Assistant

## 2018-01-29 MED ORDER — HYDROCODONE-HOMATROPINE 5-1.5 MG/5ML PO SYRP: 5.0000 mL | ORAL_SOLUTION | Freq: Three times a day (TID) | ORAL | 0 refills | Status: DC | PRN

## 2018-02-06 ENCOUNTER — Encounter: Payer: Self-pay | Admitting: Physician Assistant

## 2018-04-01 ENCOUNTER — Encounter: Payer: Self-pay | Admitting: Physician Assistant

## 2018-04-15 ENCOUNTER — Encounter: Payer: Self-pay | Admitting: Physician Assistant

## 2018-04-24 ENCOUNTER — Other Ambulatory Visit: Payer: Self-pay | Admitting: Physician Assistant

## 2018-04-24 DIAGNOSIS — F411 Generalized anxiety disorder: Secondary | ICD-10-CM

## 2018-05-05 DIAGNOSIS — E782 Mixed hyperlipidemia: Secondary | ICD-10-CM | POA: Diagnosis not present

## 2018-05-05 DIAGNOSIS — E559 Vitamin D deficiency, unspecified: Secondary | ICD-10-CM | POA: Diagnosis not present

## 2018-05-05 DIAGNOSIS — R7301 Impaired fasting glucose: Secondary | ICD-10-CM | POA: Diagnosis not present

## 2018-05-05 DIAGNOSIS — I10 Essential (primary) hypertension: Secondary | ICD-10-CM | POA: Diagnosis not present

## 2018-05-08 ENCOUNTER — Other Ambulatory Visit: Payer: Self-pay

## 2018-05-08 ENCOUNTER — Encounter: Payer: Self-pay | Admitting: Physician Assistant

## 2018-05-08 ENCOUNTER — Ambulatory Visit: Payer: BLUE CROSS/BLUE SHIELD | Admitting: Physician Assistant

## 2018-05-08 VITALS — BP 144/85 | HR 67 | Temp 97.1°F | Ht 64.0 in | Wt 184.2 lb

## 2018-05-08 DIAGNOSIS — F411 Generalized anxiety disorder: Secondary | ICD-10-CM | POA: Diagnosis not present

## 2018-05-08 DIAGNOSIS — F5101 Primary insomnia: Secondary | ICD-10-CM

## 2018-05-08 DIAGNOSIS — M25512 Pain in left shoulder: Secondary | ICD-10-CM

## 2018-05-08 DIAGNOSIS — G8929 Other chronic pain: Secondary | ICD-10-CM

## 2018-05-08 DIAGNOSIS — M5136 Other intervertebral disc degeneration, lumbar region: Secondary | ICD-10-CM | POA: Diagnosis not present

## 2018-05-08 DIAGNOSIS — M51369 Other intervertebral disc degeneration, lumbar region without mention of lumbar back pain or lower extremity pain: Secondary | ICD-10-CM

## 2018-05-08 MED ORDER — ALPRAZOLAM 1 MG PO TABS: 1.0000 mg | ORAL_TABLET | Freq: Three times a day (TID) | ORAL | 5 refills | Status: DC | PRN

## 2018-05-08 MED ORDER — PREDNISONE 10 MG (48) PO TBPK: ORAL_TABLET | ORAL | 0 refills | Status: DC

## 2018-05-08 MED ORDER — TRAZODONE HCL 100 MG PO TABS: 100.0000 mg | ORAL_TABLET | Freq: Every day | ORAL | 11 refills | Status: DC

## 2018-05-08 MED ORDER — CYCLOBENZAPRINE HCL 10 MG PO TABS: 10.0000 mg | ORAL_TABLET | Freq: Three times a day (TID) | ORAL | 1 refills | Status: DC | PRN

## 2018-05-08 NOTE — Progress Notes (Signed)
BP (!) 144/85   Pulse 67   Temp (!) 97.1 F (36.2 C) (Oral)   Ht 5\' 4"  (1.626 m)   Wt 184 lb 3.2 oz (83.6 kg)   BMI 31.62 kg/m    Subjective:    Patient ID: Monica Cross, female    DOB: 22-Mar-1955, 63 y.o.   MRN: [id number]  HPI: Monica Cross is a 63 y.o. female presenting on 05/08/2018 for Shoulder Pain (left) and Medication Refill  This patient comes in for recheck on a few of her conditions and she is having left shoulder pain that is been going on for a few months now.  She did not have an injury.  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  A few years ago she had problems in her right shoulder.  Ultimately she ended up with an injection in her shoulder.  She was going to go to her orthopedist to get an injection however with COVID-19 she did not want to go to [place].  So at this point we are going to use some prednisone zone and see if we can get it calm down until she can go in a few more weeks.  Her insomnia is quite bad at this time.  She had run out of trazodone.  It had been doing fairly well for her.  She would normally take 2 tablets at bedtime equaling a total of 200 mg.  And it did not leave her groggy in the morning.  The patient does have chronic pain from degenerative disc disease in the lumbar spine.  She is using diclofenac and it has been controlling her well.  ANXIETY ASSESSMENT Cause of anxiety: GAD Exacerbated by COVID-19, family issues in her home, financial issues. This patient returns for a  month recheck on narcotic use for the above named condition(s)  Current medications-citalopram 40 mg 1 daily Alprazolam 1 mg up to 3 times a day.  In coming visits we are going to try to work on switching out with BuSpar.  However at this time she is just too anxious to make any changes. Other medications tried: Zoloft, Prozac, Paxil Medication side effects-none Any concerns-no Any change in general medical condition-no  Effectiveness of current meds-good PMP AWARE website reviewed: Yes Any suspicious activity on PMP Aware: No LME daily dose: 6 LME  Contract on file Last UDS  Will obtain at next visit, had already voided upon arrival today  History of overdose or risk of abuse no   Past Medical History:  Diagnosis Date  . Carpal tunnel syndrome    Bilat  . Depression   . Headache   . Hyperlipidemia   . Hypertension   . Osteoporosis   . Vision abnormalities    Relevant past medical, surgical, family and social history reviewed and updated as indicated. Interim medical history since our last visit reviewed. Allergies and medications reviewed and updated. DATA REVIEWED: CHART IN EPIC  Family History reviewed for pertinent findings.  Review of Systems  Constitutional: Negative.  Negative for activity change, fatigue and fever.  HENT: Negative.   Eyes: Negative.   Respiratory: Negative.  Negative for cough.   Cardiovascular: Negative.  Negative for chest pain.  Gastrointestinal: Negative.  Negative for abdominal pain.  Endocrine: Negative.   Genitourinary: Negative.  Negative for dysuria.  Musculoskeletal: Positive for arthralgias, back pain, joint swelling, myalgias and neck pain.  Skin: Negative.   Neurological: Negative.     Allergies as  of 05/08/2018   No Known Allergies     Medication List       Accurate as of May 08, 2018 10:38 AM. Always use your most recent med list.        ALPRAZolam 1 MG tablet Commonly known as:  XANAX Take 1 tablet (1 mg total) by mouth 3 (three) times daily as needed for anxiety.   atorvastatin 20 MG tablet Commonly known as:  LIPITOR Take 20 mg by mouth daily.   citalopram 40 MG tablet Commonly known as:  CELEXA Take 1 tablet (40 mg total) by mouth daily.   cyclobenzaprine 10 MG tablet Commonly known as:  FLEXERIL Take 1 tablet (10 mg total) by mouth 3 (three) times daily as needed for muscle spasms.   diclofenac 75 MG EC tablet  Commonly known as:  VOLTAREN Take 1 tablet (75 mg total) by mouth 2 (two) times daily.   fenofibrate 145 MG tablet Commonly known as:  TRICOR Take 145 mg by mouth daily.   predniSONE 10 MG (48) Tbpk tablet Commonly known as:  STERAPRED UNI-PAK 48 TAB Take as directed for 12 days   SUMAtriptan 50 MG tablet Commonly known as:  IMITREX TAKE ONE TABLET BY MOUTH every TWO hours as needed FOR migraine - MAY REPEAT in TWO HOURS if headache persists OR recurs   telmisartan-hydrochlorothiazide 80-25 MG tablet Commonly known as:  MICARDIS HCT Take 1 tablet by mouth daily.   traZODone 100 MG tablet Commonly known as:  DESYREL Take 1-2 tablets (100-200 mg total) by mouth at bedtime.          Objective:    BP (!) 144/85   Pulse 67   Temp (!) 97.1 F (36.2 C) (Oral)   Ht 5\' 4"  (1.626 m)   Wt 184 lb 3.2 oz (83.6 kg)   BMI 31.62 kg/m   No Known Allergies  Wt Readings from Last 3 Encounters:  05/08/18 184 lb 3.2 oz (83.6 kg)  11/01/17 187 lb 9.6 oz (85.1 kg)  08/22/17 186 lb (84.4 kg)    Physical Exam Constitutional:      Appearance: She is well-developed.  HENT:     Head: Normocephalic and atraumatic.  Eyes:     Conjunctiva/sclera: Conjunctivae normal.     Pupils: Pupils are equal, round, and reactive to light.  Cardiovascular:     Rate and Rhythm: Normal rate and regular rhythm.     Heart sounds: Normal heart sounds.  Pulmonary:     Effort: Pulmonary effort is normal.     Breath sounds: Normal breath sounds.  Abdominal:     General: Bowel sounds are normal.     Palpations: Abdomen is soft.  Musculoskeletal:     Left shoulder: She exhibits decreased range of motion, tenderness and pain.       Arms:  Skin:    General: Skin is warm and dry.     Findings: No rash.  Neurological:     Mental Status: She is alert and oriented to person, place, and time.     Deep Tendon Reflexes: Reflexes are normal and symmetric.  Psychiatric:        Behavior: Behavior normal.         Thought Content: Thought content normal.        Judgment: Judgment normal.         Assessment & Plan:   1. DDD (degenerative disc disease), lumbar - cyclobenzaprine (FLEXERIL) 10 MG tablet; Take 1 tablet (10 mg total) by  mouth 3 (three) times daily as needed for muscle spasms.  Dispense: 60 tablet; Refill: 1  2. Generalized anxiety disorder - ALPRAZolam (XANAX) 1 MG tablet; Take 1 tablet (1 mg total) by mouth 3 (three) times daily as needed for anxiety.  Dispense: 90 tablet; Refill: 5  3. Chronic left shoulder pain - traZODone (DESYREL) 100 MG tablet; Take 1-2 tablets (100-200 mg total) by mouth at bedtime.  Dispense: 60 tablet; Refill: 11  4. Primary insomnia - traZODone (DESYREL) 100 MG tablet; Take 1-2 tablets (100-200 mg total) by mouth at bedtime.  Dispense: 60 tablet; Refill: 11   Continue all other maintenance medications as listed above.  Follow up plan: Return in about 6 months (around 11/08/2018).  Educational handout given for [place] PA-C [facility] [address]  [place], [place] [address] [phone]   05/08/2018, 10:38 AM

## 2018-06-11 ENCOUNTER — Telehealth: Payer: Self-pay | Admitting: *Deleted

## 2018-06-11 DIAGNOSIS — F411 Generalized anxiety disorder: Secondary | ICD-10-CM

## 2018-06-11 NOTE — Telephone Encounter (Signed)
Fax refill request.

## 2018-06-24 DIAGNOSIS — M67912 Unspecified disorder of synovium and tendon, left shoulder: Secondary | ICD-10-CM | POA: Diagnosis not present

## 2018-06-24 DIAGNOSIS — M25512 Pain in left shoulder: Secondary | ICD-10-CM | POA: Diagnosis not present

## 2018-06-24 DIAGNOSIS — M1712 Unilateral primary osteoarthritis, left knee: Secondary | ICD-10-CM | POA: Diagnosis not present

## 2018-06-24 DIAGNOSIS — M67911 Unspecified disorder of synovium and tendon, right shoulder: Secondary | ICD-10-CM | POA: Diagnosis not present

## 2018-06-24 DIAGNOSIS — M25511 Pain in right shoulder: Secondary | ICD-10-CM | POA: Diagnosis not present

## 2018-06-24 DIAGNOSIS — M1711 Unilateral primary osteoarthritis, right knee: Secondary | ICD-10-CM | POA: Diagnosis not present

## 2018-07-03 DIAGNOSIS — M1711 Unilateral primary osteoarthritis, right knee: Secondary | ICD-10-CM | POA: Diagnosis not present

## 2018-07-03 DIAGNOSIS — M67911 Unspecified disorder of synovium and tendon, right shoulder: Secondary | ICD-10-CM | POA: Diagnosis not present

## 2018-07-03 DIAGNOSIS — M1712 Unilateral primary osteoarthritis, left knee: Secondary | ICD-10-CM | POA: Diagnosis not present

## 2018-07-03 DIAGNOSIS — M67912 Unspecified disorder of synovium and tendon, left shoulder: Secondary | ICD-10-CM | POA: Diagnosis not present

## 2018-07-08 ENCOUNTER — Encounter: Payer: Self-pay | Admitting: Physician Assistant

## 2018-07-16 ENCOUNTER — Encounter: Payer: Self-pay | Admitting: Physician Assistant

## 2018-07-16 ENCOUNTER — Other Ambulatory Visit: Payer: Self-pay | Admitting: Physician Assistant

## 2018-07-16 MED ORDER — TELMISARTAN-HCTZ 80-25 MG PO TABS: 1.0000 | ORAL_TABLET | Freq: Every day | ORAL | 1 refills | Status: DC

## 2018-07-21 ENCOUNTER — Encounter: Payer: Self-pay | Admitting: Physician Assistant

## 2018-07-22 ENCOUNTER — Other Ambulatory Visit: Payer: Self-pay

## 2018-07-22 ENCOUNTER — Encounter: Payer: Self-pay | Admitting: Physician Assistant

## 2018-07-22 MED ORDER — TELMISARTAN-HCTZ 80-25 MG PO TABS: 1.0000 | ORAL_TABLET | Freq: Every day | ORAL | 1 refills | Status: DC

## 2018-07-23 ENCOUNTER — Other Ambulatory Visit: Payer: Self-pay | Admitting: *Deleted

## 2018-07-23 ENCOUNTER — Telehealth: Payer: Self-pay | Admitting: Physician Assistant

## 2018-07-23 MED ORDER — TELMISARTAN-HCTZ 80-25 MG PO TABS: 1.0000 | ORAL_TABLET | Freq: Every day | ORAL | 1 refills | Status: DC

## 2018-08-20 DIAGNOSIS — R7301 Impaired fasting glucose: Secondary | ICD-10-CM | POA: Diagnosis not present

## 2018-08-20 DIAGNOSIS — Z139 Encounter for screening, unspecified: Secondary | ICD-10-CM | POA: Diagnosis not present

## 2018-08-20 DIAGNOSIS — Z0130 Encounter for examination of blood pressure without abnormal findings: Secondary | ICD-10-CM | POA: Diagnosis not present

## 2018-08-20 DIAGNOSIS — Z79899 Other long term (current) drug therapy: Secondary | ICD-10-CM | POA: Diagnosis not present

## 2018-08-20 DIAGNOSIS — E782 Mixed hyperlipidemia: Secondary | ICD-10-CM | POA: Diagnosis not present

## 2018-08-20 DIAGNOSIS — E559 Vitamin D deficiency, unspecified: Secondary | ICD-10-CM | POA: Diagnosis not present

## 2018-09-30 ENCOUNTER — Encounter: Payer: Self-pay | Admitting: Physician Assistant

## 2018-09-30 ENCOUNTER — Other Ambulatory Visit: Payer: Self-pay | Admitting: Physician Assistant

## 2018-09-30 DIAGNOSIS — R519 Headache, unspecified: Secondary | ICD-10-CM

## 2018-09-30 MED ORDER — SUMATRIPTAN SUCCINATE 50 MG PO TABS: ORAL_TABLET | ORAL | 5 refills | Status: DC

## 2018-09-30 MED ORDER — TOPIRAMATE 25 MG PO TABS: 25.0000 mg | ORAL_TABLET | Freq: Two times a day (BID) | ORAL | 1 refills | Status: DC

## 2018-11-10 ENCOUNTER — Other Ambulatory Visit: Payer: Self-pay

## 2018-11-11 ENCOUNTER — Ambulatory Visit: Payer: BLUE CROSS/BLUE SHIELD | Admitting: Physician Assistant

## 2018-11-11 ENCOUNTER — Encounter: Payer: Self-pay | Admitting: Physician Assistant

## 2018-11-11 VITALS — BP 163/102 | HR 77 | Temp 98.4°F | Ht 64.0 in | Wt 189.6 lb

## 2018-11-11 DIAGNOSIS — M170 Bilateral primary osteoarthritis of knee: Secondary | ICD-10-CM | POA: Diagnosis not present

## 2018-11-11 DIAGNOSIS — F5101 Primary insomnia: Secondary | ICD-10-CM | POA: Diagnosis not present

## 2018-11-11 DIAGNOSIS — M25512 Pain in left shoulder: Secondary | ICD-10-CM

## 2018-11-11 DIAGNOSIS — Z0000 Encounter for general adult medical examination without abnormal findings: Secondary | ICD-10-CM

## 2018-11-11 DIAGNOSIS — Z79891 Long term (current) use of opiate analgesic: Secondary | ICD-10-CM | POA: Diagnosis not present

## 2018-11-11 DIAGNOSIS — G8929 Other chronic pain: Secondary | ICD-10-CM

## 2018-11-11 DIAGNOSIS — I10 Essential (primary) hypertension: Secondary | ICD-10-CM

## 2018-11-11 DIAGNOSIS — F411 Generalized anxiety disorder: Secondary | ICD-10-CM | POA: Diagnosis not present

## 2018-11-11 MED ORDER — TOPIRAMATE 25 MG PO TABS: 25.0000 mg | ORAL_TABLET | Freq: Two times a day (BID) | ORAL | 11 refills | Status: DC

## 2018-11-11 MED ORDER — FENOFIBRATE 145 MG PO TABS: 145.0000 mg | ORAL_TABLET | Freq: Every day | ORAL | 3 refills | Status: DC

## 2018-11-11 MED ORDER — DICLOFENAC SODIUM 75 MG PO TBEC: 75.0000 mg | DELAYED_RELEASE_TABLET | Freq: Two times a day (BID) | ORAL | 6 refills | Status: DC

## 2018-11-11 MED ORDER — TRAZODONE HCL 100 MG PO TABS: 100.0000 mg | ORAL_TABLET | Freq: Every day | ORAL | 11 refills | Status: DC

## 2018-11-11 MED ORDER — ALPRAZOLAM 1 MG PO TABS: 1.0000 mg | ORAL_TABLET | Freq: Three times a day (TID) | ORAL | 5 refills | Status: DC | PRN

## 2018-11-11 MED ORDER — TELMISARTAN-HCTZ 80-25 MG PO TABS: 1.0000 | ORAL_TABLET | Freq: Every day | ORAL | 1 refills | Status: DC

## 2018-11-11 MED ORDER — ATORVASTATIN CALCIUM 20 MG PO TABS: 20.0000 mg | ORAL_TABLET | Freq: Every day | ORAL | 3 refills | Status: DC

## 2018-11-11 MED ORDER — CITALOPRAM HYDROBROMIDE 40 MG PO TABS: 40.0000 mg | ORAL_TABLET | Freq: Every day | ORAL | 3 refills | Status: DC

## 2018-11-12 NOTE — Progress Notes (Signed)
BP (!) 163/102   Pulse 77   Temp 98.4 F (36.9 C) (Temporal)   Ht 5' 4" (1.626 m)   Wt 189 lb 9.6 oz (86 kg)   SpO2 98%   BMI 32.54 kg/m    Subjective:    Patient ID: Monica Cross, female    DOB: May 21, 1955, 63 y.o.   MRN: [id number]  HPI: Monica Cross is a 63 y.o. female presenting on 11/11/2018 for a follow up on her chronic medical conditons which include hypertension, hyperlipidemia, degenerative disc disease, and arthritis.   This patient is having a follow-up on her chronic medical conditions.  At this point she is doing fairly well.  She is having a significant amount of stressors because of family members living in her house and not doing very well.  Is that she does need to continue to take her alprazolam if at all possible.  We will continue her medication number going to try to work on starting [place] and see if this can help as a steady anxiolytic.  She is willing to try this and we will see how she does and have her come back in a few months.  The remainder of her chart is reviewed along with conditions, medications and labs.  She denies any new issues. Past Medical History:  Diagnosis Date  . Carpal tunnel syndrome    Bilat  . Depression   . Headache   . Hyperlipidemia   . Hypertension   . Osteoporosis   . Vision abnormalities    Relevant past medical, surgical, family and social history reviewed and updated as indicated. Interim medical history since our last visit reviewed. Allergies and medications reviewed and updated. DATA REVIEWED: CHART IN EPIC  Family History reviewed for pertinent findings.  Review of Systems  Constitutional: Negative.   HENT: Negative.   Eyes: Negative.   Respiratory: Negative.   Gastrointestinal: Negative.   Genitourinary: Negative.   Psychiatric/Behavioral: Positive for dysphoric mood. The patient is nervous/anxious.     Allergies as of 11/11/2018   No Known Allergies     Medication List       Accurate as of November 11, 2018 11:59 PM. If you have any questions, ask your nurse or doctor.        STOP taking these medications   predniSONE 10 MG (48) Tbpk tablet Commonly known as: STERAPRED UNI-PAK 48 TAB Stopped by: Terald Sleeper, PA-C     TAKE these medications   ALPRAZolam 1 MG tablet Commonly known as: XANAX Take 1 tablet (1 mg total) by mouth 3 (three) times daily as needed for anxiety.   atorvastatin 20 MG tablet Commonly known as: LIPITOR Take 1 tablet (20 mg total) by mouth daily.   citalopram 40 MG tablet Commonly known as: CELEXA Take 1 tablet (40 mg total) by mouth daily.   cyclobenzaprine 10 MG tablet Commonly known as: FLEXERIL Take 1 tablet (10 mg total) by mouth 3 (three) times daily as needed for muscle spasms.   diclofenac 75 MG EC tablet Commonly known as: VOLTAREN Take 1 tablet (75 mg total) by mouth 2 (two) times daily.   fenofibrate 145 MG tablet Commonly known as: TRICOR Take 1 tablet (145 mg total) by mouth daily.   SUMAtriptan 50 MG tablet Commonly known as: IMITREX TAKE ONE TABLET BY MOUTH every TWO hours as needed FOR migraine - MAY REPEAT in TWO HOURS if headache persists OR recurs   telmisartan-hydrochlorothiazide 80-25 MG tablet Commonly known as: MICARDIS  HCT Take 1 tablet by mouth daily.   topiramate 25 MG tablet Commonly known as: Topamax Take 1 tablet (25 mg total) by mouth 2 (two) times daily. Start with 1 tab and increase by one tab each week What changed: how much to take Changed by: Terald Sleeper, PA-C   traZODone 100 MG tablet Commonly known as: DESYREL Take 1-2 tablets (100-200 mg total) by mouth at bedtime.          Objective:    BP (!) 163/102   Pulse 77   Temp 98.4 F (36.9 C) (Temporal)   Ht 5' 4" (1.626 m)   Wt 189 lb 9.6 oz (86 kg)   SpO2 98%   BMI 32.54 kg/m   No Known Allergies  Wt Readings from Last 3 Encounters:  11/11/18 189 lb 9.6 oz (86 kg)  05/08/18 184 lb 3.2 oz (83.6 kg)  11/01/17 187 lb 9.6 oz (85.1 kg)     Physical Exam Constitutional:      General: She is not in acute distress.    Appearance: Normal appearance. She is well-developed.  HENT:     Head: Normocephalic and atraumatic.  Cardiovascular:     Rate and Rhythm: Normal rate.  Pulmonary:     Effort: Pulmonary effort is normal.  Skin:    General: Skin is warm and dry.     Findings: No rash.  Neurological:     Mental Status: She is alert and oriented to person, place, and time.     Deep Tendon Reflexes: Reflexes are normal and symmetric.     Results for orders placed or performed in visit on 07/11/17  CBC with Differential/Platelet  Result Value Ref Range   WBC 8.7 3.4 - 10.8 x10E3/uL   RBC 3.94 3.77 - 5.28 x10E6/uL   Hemoglobin 11.8 11.1 - 15.9 g/dL   Hematocrit 37.6 34.0 - 46.6 %   MCV 95 79 - 97 fL   MCH 29.9 26.6 - 33.0 pg   MCHC 31.4 (L) 31.5 - 35.7 g/dL   RDW 13.5 12.3 - 15.4 %   Platelets 487 (H) 150 - 450 x10E3/uL   Neutrophils 66 Not Estab. %   Lymphs 27 Not Estab. %   Monocytes 5 Not Estab. %   Eos 1 Not Estab. %   Basos 1 Not Estab. %   Neutrophils Absolute 5.7 1.4 - 7.0 x10E3/uL   Lymphocytes Absolute 2.4 0.7 - 3.1 x10E3/uL   Monocytes Absolute 0.4 0.1 - 0.9 x10E3/uL   EOS (ABSOLUTE) 0.1 0.0 - 0.4 x10E3/uL   Basophils Absolute 0.0 0.0 - 0.2 x10E3/uL   Immature Granulocytes 0 Not Estab. %   Immature Grans (Abs) 0.0 0.0 - 0.1 x10E3/uL  Iron  Result Value Ref Range   Iron 55 27 - 159 ug/dL  Hepatitis C antibody  Result Value Ref Range   Hep C Virus Ab <0.1 0.0 - 0.9 s/co ratio      Assessment & Plan:   1. Generalized anxiety disorder - ALPRAZolam (XANAX) 1 MG tablet; Take 1 tablet (1 mg total) by mouth 3 (three) times daily as needed for anxiety.  Dispense: 90 tablet; Refill: 5 - citalopram (CELEXA) 40 MG tablet; Take 1 tablet (40 mg total) by mouth daily.  Dispense: 90 tablet; Refill: 3  2. Chronic left shoulder pain - traZODone (DESYREL) 100 MG tablet; Take 1-2 tablets (100-200 mg total) by mouth  at bedtime.  Dispense: 60 tablet; Refill: 11  3. Primary insomnia - traZODone (DESYREL) 100  MG tablet; Take 1-2 tablets (100-200 mg total) by mouth at bedtime.  Dispense: 60 tablet; Refill: 11  4. Primary osteoarthritis of both knees - diclofenac (VOLTAREN) 75 MG EC tablet; Take 1 tablet (75 mg total) by mouth 2 (two) times daily.  Dispense: 60 tablet; Refill: 6  5. Well adult exam - CBC with Differential/Platelet - CMP14+EGFR - Lipid panel  6. Essential hypertension - CBC with Differential/Platelet - CMP14+EGFR - Lipid panel  7. GAD (generalized anxiety disorder) - ToxASSURE Select 13 (MW), Urine   Continue all other maintenance medications as listed above.  Follow up plan: No follow-ups on file.  Educational handout given for [place] PA-C [facility] [address]  [place], [place] [address] [phone]   11/12/2018, 12:24 PM

## 2018-11-13 LAB — TOXASSURE SELECT 13 (MW), URINE

## 2018-11-26 DIAGNOSIS — Z23 Encounter for immunization: Secondary | ICD-10-CM | POA: Diagnosis not present

## 2018-12-30 DIAGNOSIS — M25552 Pain in left hip: Secondary | ICD-10-CM | POA: Diagnosis not present

## 2018-12-30 DIAGNOSIS — M7062 Trochanteric bursitis, left hip: Secondary | ICD-10-CM | POA: Diagnosis not present

## 2019-02-18 ENCOUNTER — Encounter: Payer: Self-pay | Admitting: Physician Assistant

## 2019-02-20 ENCOUNTER — Ambulatory Visit (INDEPENDENT_AMBULATORY_CARE_PROVIDER_SITE_OTHER): Payer: BLUE CROSS/BLUE SHIELD | Admitting: Physician Assistant

## 2019-02-20 ENCOUNTER — Encounter: Payer: Self-pay | Admitting: Physician Assistant

## 2019-02-20 DIAGNOSIS — J209 Acute bronchitis, unspecified: Secondary | ICD-10-CM

## 2019-02-20 MED ORDER — AZITHROMYCIN 250 MG PO TABS: ORAL_TABLET | ORAL | 0 refills | Status: DC

## 2019-02-20 MED ORDER — HYDROCODONE-HOMATROPINE 5-1.5 MG/5ML PO SYRP: 5.0000 mL | ORAL_SOLUTION | Freq: Four times a day (QID) | ORAL | 0 refills | Status: DC | PRN

## 2019-02-20 MED ORDER — ALBUTEROL SULFATE HFA 108 (90 BASE) MCG/ACT IN AERS: 2.0000 | INHALATION_SPRAY | Freq: Four times a day (QID) | RESPIRATORY_TRACT | 0 refills | Status: DC | PRN

## 2019-02-20 NOTE — Progress Notes (Signed)
Telephone visit  Subjective: [id number] and cold for 6 days PCP: Terald Sleeper, PA-C [phone] Monica Cross is a 64 y.o. female calls for telephone consult today. Patient provides verbal consent for consult held via phone.  Patient is identified with 2 separate identifiers.  At this time the entire area is on COVID-19 social distancing and stay home orders are in place.  Patient is of higher risk and therefore we are performing this by a virtual method.  Location of patient: home Location of provider: [facility] Others present for call: no  This patient has had many days of sinus headache and postnasal drainage. There is copious drainage at times. Denies any fever at this time. There has been a history of sinus infections in the past.  No history of sinus surgery. There is cough at night. It has become more prevalent in recent days.  Uses Nyquil with minimal help  Cough is quite severe  Slight productive cough   ROS: Per HPI  No Known Allergies Past Medical History:  Diagnosis Date  . Carpal tunnel syndrome    Bilat  . Depression   . Headache   . Hyperlipidemia   . Hypertension   . Osteoporosis   . Vision abnormalities     Current Outpatient Medications:  .  ALPRAZolam (XANAX) 1 MG tablet, Take 1 tablet (1 mg total) by mouth 3 (three) times daily as needed for anxiety., Disp: 90 tablet, Rfl: 5 .  atorvastatin (LIPITOR) 20 MG tablet, Take 1 tablet (20 mg total) by mouth daily., Disp: 90 tablet, Rfl: 3 .  citalopram (CELEXA) 40 MG tablet, Take 1 tablet (40 mg total) by mouth daily., Disp: 90 tablet, Rfl: 3 .  cyclobenzaprine (FLEXERIL) 10 MG tablet, Take 1 tablet (10 mg total) by mouth 3 (three) times daily as needed for muscle spasms., Disp: 60 tablet, Rfl: 1 .  diclofenac (VOLTAREN) 75 MG EC tablet, Take 1 tablet (75 mg total) by mouth 2 (two) times daily., Disp: 60 tablet, Rfl: 6 .  fenofibrate (TRICOR) 145 MG tablet, Take 1 tablet (145 mg total) by mouth daily., Disp: 90  tablet, Rfl: 3 .  SUMAtriptan (IMITREX) 50 MG tablet, TAKE ONE TABLET BY MOUTH every TWO hours as needed FOR migraine - MAY REPEAT in TWO HOURS if headache persists OR recurs, Disp: 9 tablet, Rfl: 5 .  telmisartan-hydrochlorothiazide (MICARDIS HCT) 80-25 MG tablet, Take 1 tablet by mouth daily., Disp: 90 tablet, Rfl: 1 .  topiramate (TOPAMAX) 25 MG tablet, Take 1 tablet (25 mg total) by mouth 2 (two) times daily. Start with 1 tab and increase by one tab each week, Disp: 60 tablet, Rfl: 11 .  traZODone (DESYREL) 100 MG tablet, Take 1-2 tablets (100-200 mg total) by mouth at bedtime., Disp: 60 tablet, Rfl: 11  Assessment/ Plan: 64 y.o. female   1. Acute bronchitis, unspecified organism - azithromycin (ZITHROMAX Z-PAK) 250 MG tablet; Take as directed  Dispense: 6 each; Refill: 0 - albuterol (VENTOLIN HFA) 108 (90 Base) MCG/ACT inhaler; Inhale 2 puffs into the lungs every 6 (six) hours as needed for wheezing or shortness of breath.  Dispense: 18 g; Refill: 0 - HYDROcodone-homatropine (HYCODAN) 5-1.5 MG/5ML syrup; Take 5 mLs by mouth every 6 (six) hours as needed for cough.  Dispense: 120 mL; Refill: 0    No follow-ups on file.  Continue all other maintenance medications as listed above.  Start time: 8:17 AM End time: 8:26 AM  No orders of the defined types were placed  in this encounter.   Particia Nearing PA-C [facility] [phone]

## 2019-02-23 ENCOUNTER — Ambulatory Visit (INDEPENDENT_AMBULATORY_CARE_PROVIDER_SITE_OTHER): Payer: BC Managed Care – PPO | Admitting: Family

## 2019-02-23 ENCOUNTER — Encounter: Payer: Self-pay | Admitting: Family

## 2019-02-23 ENCOUNTER — Other Ambulatory Visit: Payer: Self-pay

## 2019-02-23 DIAGNOSIS — J209 Acute bronchitis, unspecified: Secondary | ICD-10-CM

## 2019-02-23 MED ORDER — PREDNISONE 10 MG (21) PO TBPK: ORAL_TABLET | ORAL | 0 refills | Status: DC

## 2019-02-23 MED ORDER — BENZONATATE 200 MG PO CAPS: 200.0000 mg | ORAL_CAPSULE | Freq: Three times a day (TID) | ORAL | 1 refills | Status: DC | PRN

## 2019-02-23 NOTE — Progress Notes (Signed)
Virtual Visit via telephone Note Due to COVID-19 pandemic this visit was conducted virtually. This visit type was conducted due to national recommendations for restrictions regarding the COVID-19 Pandemic (e.g. social distancing, sheltering in place) in an effort to limit this patient's exposure and mitigate transmission in our community. All issues noted in this document were discussed and addressed.  A physical exam was not performed with this format.  I connected with Monica Cross on 02/23/19 at 8: 28 AM  by telephone and verified that I am speaking with the correct person using two identifiers. Monica Cross is currently located at home and no one is currently with her during visit. The provider, Evelina Dun, FNP is located in their office at time of visit.  I discussed the limitations, risks, security and privacy concerns of performing an evaluation and management service by telephone and the availability of in person appointments. I also discussed with the patient that there may be a patient responsible charge related to this service. The patient expressed understanding and agreed to proceed.   History and Present Illness:  PT calls the office today with worsening cough. She had a telephone visit on 02/20/19 and was given albuterol inhaler, Zpak, and cough syrup. She states her cough is the same and is worse when she gets hot. She went to work today and was sent home because of her coughing. Cough This is a new problem. The current episode started in the past 7 days. The problem has been unchanged. The problem occurs every few minutes. The cough is non-productive. Associated symptoms include nasal congestion. Pertinent negatives include no chills, ear congestion, ear pain, fever, headaches, myalgias, postnasal drip, sore throat, shortness of breath or wheezing. The symptoms are aggravated by lying down. She has tried rest and a beta-agonist inhaler for the symptoms. The treatment provided mild  relief.       Review of Systems  Constitutional: Negative for chills and fever.  HENT: Negative for ear pain, postnasal drip and sore throat.   Respiratory: Positive for cough. Negative for shortness of breath and wheezing.   Musculoskeletal: Negative for myalgias.  Neurological: Negative for headaches.  All other systems reviewed and are negative.    Observations/Objective: No SOB or distress noted, intermittent cough   Assessment and Plan: Monica Cross comes in today with chief complaint of No chief complaint on file.   Diagnosis and orders addressed:  1. Acute bronchitis, unspecified organism Continue other medications Start prednisone and tessalon as needed Work note given [employer] fluids Call office if symptoms worsens or do not improve  - predniSONE (STERAPRED UNI-PAK 21 TAB) 10 MG (21) TBPK tablet; Use as directed  Dispense: 21 tablet; Refill: 0 - benzonatate (TESSALON) 200 MG capsule; Take 1 capsule (200 mg total) by mouth 3 (three) times daily as needed.  Dispense: 30 capsule; Refill: 1      I discussed the assessment and treatment plan with the patient. The patient was provided an opportunity to ask questions and all were answered. The patient agreed with the plan and demonstrated an understanding of the instructions.   The patient was advised to call back or seek an in-person evaluation if the symptoms worsen or if the condition fails to improve as anticipated.  The above assessment and management plan was discussed with the patient. The patient verbalized understanding of and has agreed to the management plan. Patient is aware to call the clinic if symptoms persist or worsen. Patient is aware when to return  to the clinic for a follow-up visit. Patient educated on when it is appropriate to go to the emergency department.   Time call ended:  8:44 AM  I provided 16 minutes of non-face-to-face time during this encounter.    Evelina Dun, FNP

## 2019-03-16 ENCOUNTER — Other Ambulatory Visit: Payer: Self-pay | Admitting: Physician Assistant

## 2019-03-16 DIAGNOSIS — M5136 Other intervertebral disc degeneration, lumbar region: Secondary | ICD-10-CM

## 2019-03-18 ENCOUNTER — Encounter: Payer: Self-pay | Admitting: Rheumatology

## 2019-03-27 ENCOUNTER — Telehealth: Payer: Self-pay | Admitting: Physician Assistant

## 2019-03-30 NOTE — Telephone Encounter (Signed)
LEFT MESSAGE TO RETURN CALL

## 2019-04-01 NOTE — Telephone Encounter (Signed)
Left message to call back  

## 2019-04-01 NOTE — Telephone Encounter (Signed)
thanks

## 2019-04-01 NOTE — Telephone Encounter (Signed)
Patient reports she is only taking Diclofenac.

## 2019-04-27 ENCOUNTER — Ambulatory Visit (INDEPENDENT_AMBULATORY_CARE_PROVIDER_SITE_OTHER): Payer: BC Managed Care – PPO | Admitting: Physician Assistant

## 2019-04-27 ENCOUNTER — Encounter: Payer: Self-pay | Admitting: Physician Assistant

## 2019-04-27 DIAGNOSIS — M5136 Other intervertebral disc degeneration, lumbar region: Secondary | ICD-10-CM

## 2019-04-27 DIAGNOSIS — M79606 Pain in leg, unspecified: Secondary | ICD-10-CM | POA: Diagnosis not present

## 2019-04-27 MED ORDER — PREDNISONE 10 MG (48) PO TBPK: ORAL_TABLET | ORAL | 0 refills | Status: DC

## 2019-04-27 NOTE — Progress Notes (Signed)
Telephone visit  Subjective: CC: Leg and back pain PCP: Terald Sleeper, PA-C [id number] Monica Cross is a 64 y.o. female calls for telephone consult today. Patient provides verbal consent for consult held via phone.  Patient is identified with 2 separate identifiers.  At this time the entire area is on COVID-19 social distancing and stay home orders are in place.  Patient is of higher risk and therefore we are performing this by a virtual method.  Location of patient: Home Location of provider: [facility] Others present for call: No   The patient is having a telephone visit for increased pain in her low back and right leg.  It began hurting much more about 1 month ago.  It has been worsening.  She denies having any falls.  She states that standing is the most bothersome.  She does work 12-hour shifts 3 to 5 days/week.  After sitting long, her tailbone can even hurt and even shift over into the left leg on some occasions.  She has seen orthopedics in the past, Dr. Berenice Primas looked at her hip.  Someone will work on the medicine and referral to orthopedics.  ROS: Per HPI  No Known Allergies Past Medical History:  Diagnosis Date  . Carpal tunnel syndrome    Bilat  . Depression   . Headache   . Hyperlipidemia   . Hypertension   . Osteoporosis   . Vision abnormalities     Current Outpatient Medications:  .  albuterol (VENTOLIN HFA) 108 (90 Base) MCG/ACT inhaler, Inhale 2 puffs into the lungs every 6 (six) hours as needed for wheezing or shortness of breath., Disp: 18 g, Rfl: 0 .  ALPRAZolam (XANAX) 1 MG tablet, Take 1 tablet (1 mg total) by mouth 3 (three) times daily as needed for anxiety., Disp: 90 tablet, Rfl: 5 .  atorvastatin (LIPITOR) 20 MG tablet, Take 1 tablet (20 mg total) by mouth daily., Disp: 90 tablet, Rfl: 3 .  citalopram (CELEXA) 40 MG tablet, Take 1 tablet (40 mg total) by mouth daily., Disp: 90 tablet, Rfl: 3 .  cyclobenzaprine (FLEXERIL) 10 MG tablet, TAKE ONE TABLET BY  MOUTH THREE TIMES DAILY AS NEEDED FOR MUSCLE SPASMS, Disp: 60 tablet, Rfl: 1 .  fenofibrate (TRICOR) 145 MG tablet, Take 1 tablet (145 mg total) by mouth daily., Disp: 90 tablet, Rfl: 3 .  HYDROcodone-homatropine (HYCODAN) 5-1.5 MG/5ML syrup, Take 5 mLs by mouth every 6 (six) hours as needed for cough., Disp: 120 mL, Rfl: 0 .  predniSONE (STERAPRED UNI-PAK 48 TAB) 10 MG (48) TBPK tablet, Take as directed for 12 days, Disp: 48 tablet, Rfl: 0 .  SUMAtriptan (IMITREX) 50 MG tablet, TAKE ONE TABLET BY MOUTH every TWO hours as needed FOR migraine - MAY REPEAT in TWO HOURS if headache persists OR recurs, Disp: 9 tablet, Rfl: 5 .  telmisartan-hydrochlorothiazide (MICARDIS HCT) 80-25 MG tablet, Take 1 tablet by mouth daily., Disp: 90 tablet, Rfl: 1 .  topiramate (TOPAMAX) 25 MG tablet, Take 1 tablet (25 mg total) by mouth 2 (two) times daily. Start with 1 tab and increase by one tab each week, Disp: 60 tablet, Rfl: 11 .  traZODone (DESYREL) 100 MG tablet, Take 1-2 tablets (100-200 mg total) by mouth at bedtime., Disp: 60 tablet, Rfl: 11  Assessment/ Plan: 64 y.o. female   1. DDD (degenerative disc disease), lumbar - Ambulatory referral to Orthopedic Surgery  2. Pain of lower extremity, unspecified laterality - Ambulatory referral to Orthopedic Surgery  Return if symptoms worsen or fail to improve.  Continue all other maintenance medications as listed above.  I discussed the assessment and treatment plan with the patient. The patient was provided an opportunity to ask questions and all were answered. The patient agreed with the plan and demonstrated an understanding of the instructions.   The patient was advised to call back or seek an in-person evaluation if the symptoms worsen or if the condition fails to improve as anticipated.   The above assessment and management plan was discussed with the patient. The patient verbalized understanding of and has agreed to the management plan. Patient is  aware to call the clinic if symptoms persist or worsen. Patient is aware when to return to the clinic for a follow-up visit. Patient educated on when it is appropriate to go to the emergency department.    Start time: 3:39 PM End time: 3:50 PM  Meds ordered this encounter  Medications  . predniSONE (STERAPRED UNI-PAK 48 TAB) 10 MG (48) TBPK tablet    Sig: Take as directed for 12 days    Dispense:  48 tablet    Refill:  0    Order Specific Question:   Supervising Provider    Answer:   Janora Norlander [id number]    Particia Nearing PA-C [place] [phone]

## 2019-05-07 DIAGNOSIS — M25531 Pain in right wrist: Secondary | ICD-10-CM | POA: Diagnosis not present

## 2019-05-07 DIAGNOSIS — G5601 Carpal tunnel syndrome, right upper limb: Secondary | ICD-10-CM | POA: Diagnosis not present

## 2019-05-07 DIAGNOSIS — M25561 Pain in right knee: Secondary | ICD-10-CM | POA: Diagnosis not present

## 2019-05-07 DIAGNOSIS — M79661 Pain in right lower leg: Secondary | ICD-10-CM | POA: Diagnosis not present

## 2019-05-18 ENCOUNTER — Other Ambulatory Visit: Payer: Self-pay | Admitting: Family Medicine

## 2019-05-18 DIAGNOSIS — F411 Generalized anxiety disorder: Secondary | ICD-10-CM

## 2019-05-26 ENCOUNTER — Encounter: Payer: Self-pay | Admitting: Family

## 2019-05-26 ENCOUNTER — Telehealth (INDEPENDENT_AMBULATORY_CARE_PROVIDER_SITE_OTHER): Payer: BC Managed Care – PPO | Admitting: Family

## 2019-05-26 DIAGNOSIS — I10 Essential (primary) hypertension: Secondary | ICD-10-CM | POA: Diagnosis not present

## 2019-05-26 DIAGNOSIS — E7800 Pure hypercholesterolemia, unspecified: Secondary | ICD-10-CM | POA: Diagnosis not present

## 2019-05-26 DIAGNOSIS — G43109 Migraine with aura, not intractable, without status migrainosus: Secondary | ICD-10-CM | POA: Diagnosis not present

## 2019-05-26 DIAGNOSIS — F332 Major depressive disorder, recurrent severe without psychotic features: Secondary | ICD-10-CM

## 2019-05-26 DIAGNOSIS — G4700 Insomnia, unspecified: Secondary | ICD-10-CM | POA: Insufficient documentation

## 2019-05-26 DIAGNOSIS — F411 Generalized anxiety disorder: Secondary | ICD-10-CM

## 2019-05-26 MED ORDER — ALPRAZOLAM 1 MG PO TABS: 1.0000 mg | ORAL_TABLET | Freq: Three times a day (TID) | ORAL | 2 refills | Status: DC | PRN

## 2019-05-26 MED ORDER — TOPIRAMATE 50 MG PO TABS: ORAL_TABLET | ORAL | 0 refills | Status: DC

## 2019-05-26 NOTE — Progress Notes (Signed)
Virtual Visit via telephone Note Due to COVID-19 pandemic this visit was conducted virtually. This visit type was conducted due to national recommendations for restrictions regarding the COVID-19 Pandemic (e.g. social distancing, sheltering in place) in an effort to limit this patient's exposure and mitigate transmission in our community. All issues noted in this document were discussed and addressed.  A physical exam was not performed with this format.  I connected with Monica Cross on 05/26/19 at 11:12 AM by telephone and verified that I am speaking with the correct person using two identifiers. Monica Cross is currently located at home and no one is currently with her  during visit. The provider, Evelina Dun, FNP is located in their office at time of visit.  I discussed the limitations, risks, security and privacy concerns of performing an evaluation and management service by telephone and the availability of in person appointments. I also discussed with the patient that there may be a patient responsible charge related to this service. The patient expressed understanding and agreed to proceed.   History and Present Illness:  Pt calls the office today for chronic follow up. She is followed by Ortho for joint pain.   Anxiety Presents for follow-up visit. Symptoms include depressed mood, excessive worry, insomnia, irritability, nausea, panic and restlessness. Symptoms occur occasionally. The severity of symptoms is moderate.    Migraine  This is a chronic problem. The current episode started more than 1 year ago. The problem occurs daily. The problem has been unchanged. The pain is located in the occipital region. The pain quality is similar to prior headaches. Associated symptoms include insomnia, nausea, phonophobia and photophobia. The symptoms are aggravated by emotional stress. She has tried triptans for the symptoms. The treatment provided mild relief.  Hyperlipidemia This is a  chronic problem. The current episode started more than 1 year ago. The problem is controlled. Recent lipid tests were reviewed and are normal. Current antihyperlipidemic treatment includes fibric acid derivatives and statins. The current treatment provides moderate improvement of lipids. Risk factors for coronary artery disease include a sedentary lifestyle, post-menopausal and dyslipidemia.  Insomnia Primary symptoms: difficulty falling asleep, frequent awakening.  The current episode started more than one year. The onset quality is gradual. The problem occurs intermittently.     Review of Systems  Constitutional: Positive for irritability.  Eyes: Positive for photophobia.  Gastrointestinal: Positive for nausea.  Psychiatric/Behavioral: The patient has insomnia.   All other systems reviewed and are negative.    Observations/Objective: No SOB or distress noted   Assessment and Plan: Monica Cross comes in today with chief complaint of No chief complaint on file.   Diagnosis and orders addressed:  1. Essential hypertension  2. Migraine with aura and without status migrainosus, not intractable Will increase Topamax to 50 mg BID from 25 mg  Then in 2 weeks increase to 100 mg BID Stress management  Avoid caffeine  Stress management RTO in 6 weeks  - topiramate (TOPAMAX) 50 MG tablet; Take 1 tablet (50 mg total) by mouth 2 (two) times daily for 14 days, THEN 2 tablets (100 mg total) 2 (two) times daily for 14 days.  Dispense: 84 tablet; Refill: 0  3. Generalized anxiety disorder Discussed decreasing dose, pt states she will try to decrease to BID - ALPRAZolam (XANAX) 1 MG tablet; Take 1 tablet (1 mg total) by mouth 3 (three) times daily as needed for anxiety.  Dispense: 90 tablet; Refill: 2  4. 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 Insomnia, unspecified type   Labs pending [place] controlled  database reviewed, contract and drug screen up to date Health Maintenance reviewed Diet and exercise encouraged  Follow up plan: 6 weeks to recheck on migraines     I discussed the assessment and treatment plan with the patient. The patient was provided an opportunity to ask questions and all were answered. The patient agreed with the plan and demonstrated an understanding of the instructions.   The patient was advised to call back or seek an in-person evaluation if the symptoms worsen or if the condition fails to improve as anticipated.  The above assessment and management plan was discussed with the patient. The patient verbalized understanding of and has agreed to the management plan. Patient is aware to call the clinic if symptoms persist or worsen. Patient is aware when to return to the clinic for a follow-up visit. Patient educated on when it is appropriate to go to the emergency department.   Time call ended: 11:36 AM   I provided 24 minutes of non-face-to-face time during this encounter.    Evelina Dun, FNP

## 2019-05-29 ENCOUNTER — Ambulatory Visit: Payer: Self-pay

## 2019-06-08 DIAGNOSIS — M25551 Pain in right hip: Secondary | ICD-10-CM | POA: Diagnosis not present

## 2019-06-08 DIAGNOSIS — M545 Low back pain: Secondary | ICD-10-CM | POA: Diagnosis not present

## 2019-06-08 DIAGNOSIS — M5441 Lumbago with sciatica, right side: Secondary | ICD-10-CM | POA: Diagnosis not present

## 2019-06-12 IMAGING — DX DG SHOULDER 2+V*R*
3 series · 3 of 3 positions shown · non-contrast
Comparison: None.

CLINICAL DATA: Right shoulder pain.  No injury.

EXAM:
RIGHT SHOULDER - 2+ VIEW

[shoulder ap]
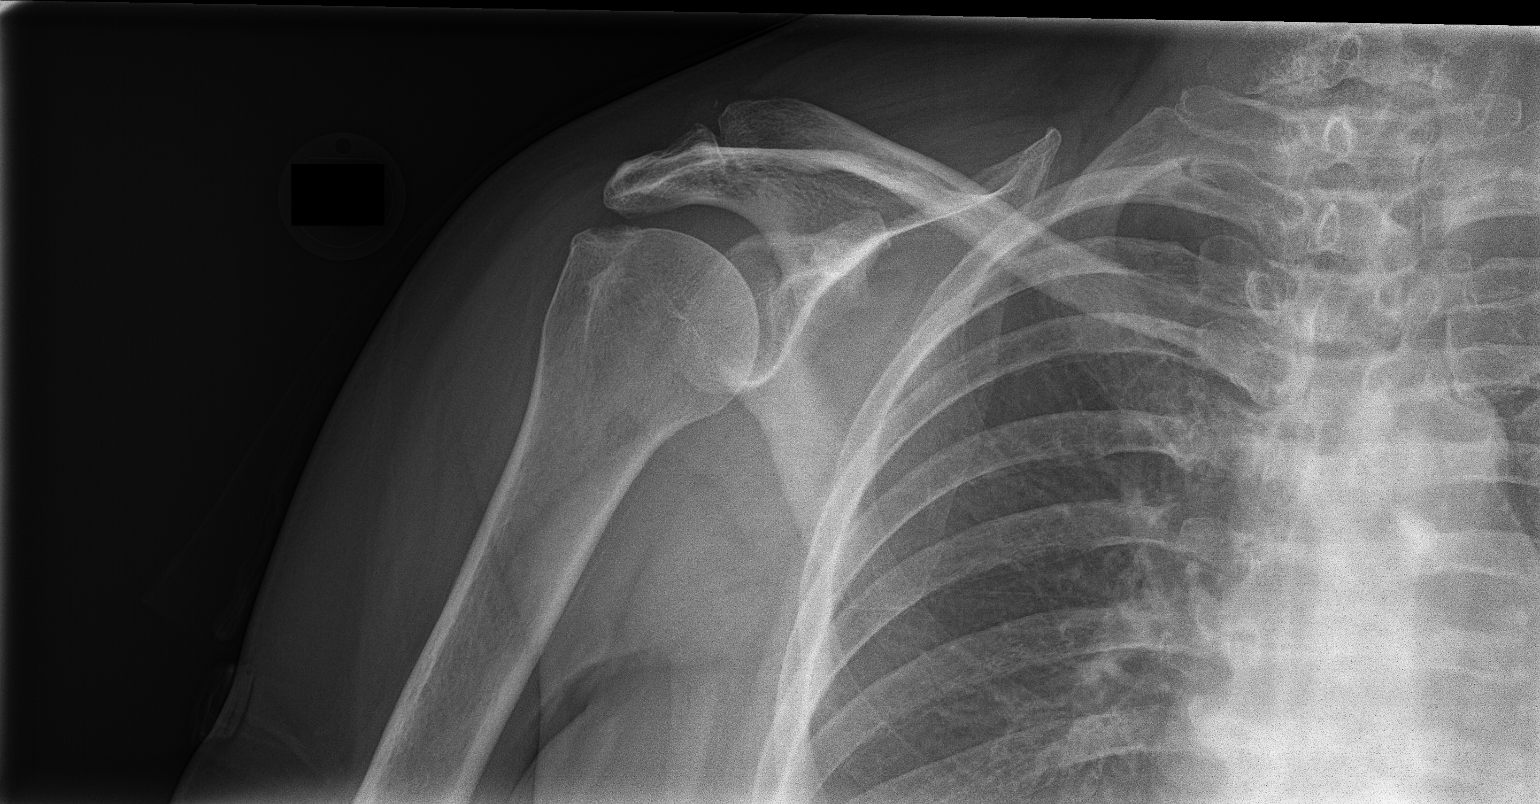

[shoulder obl]
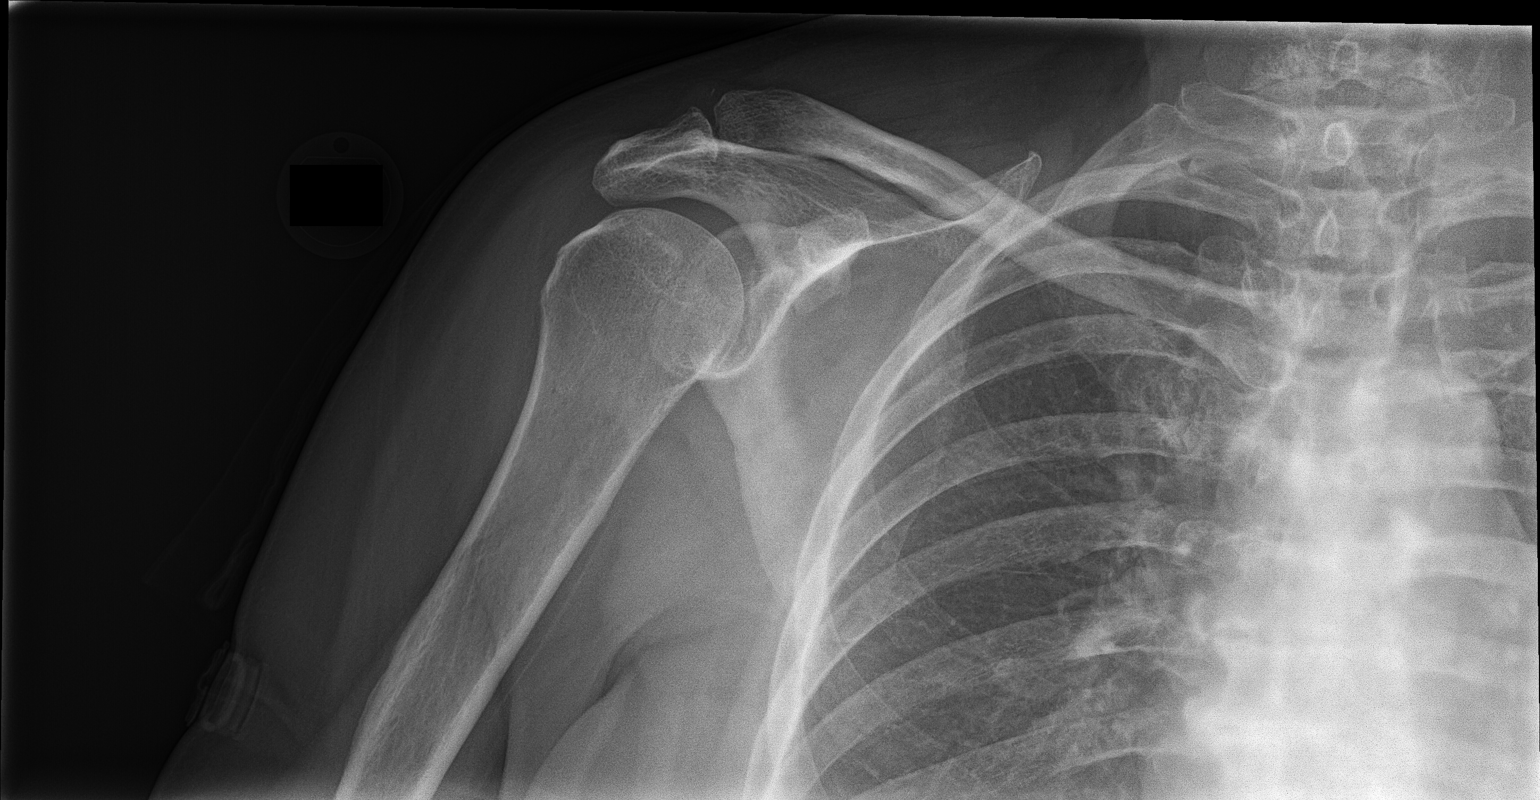

[shoulder axial]
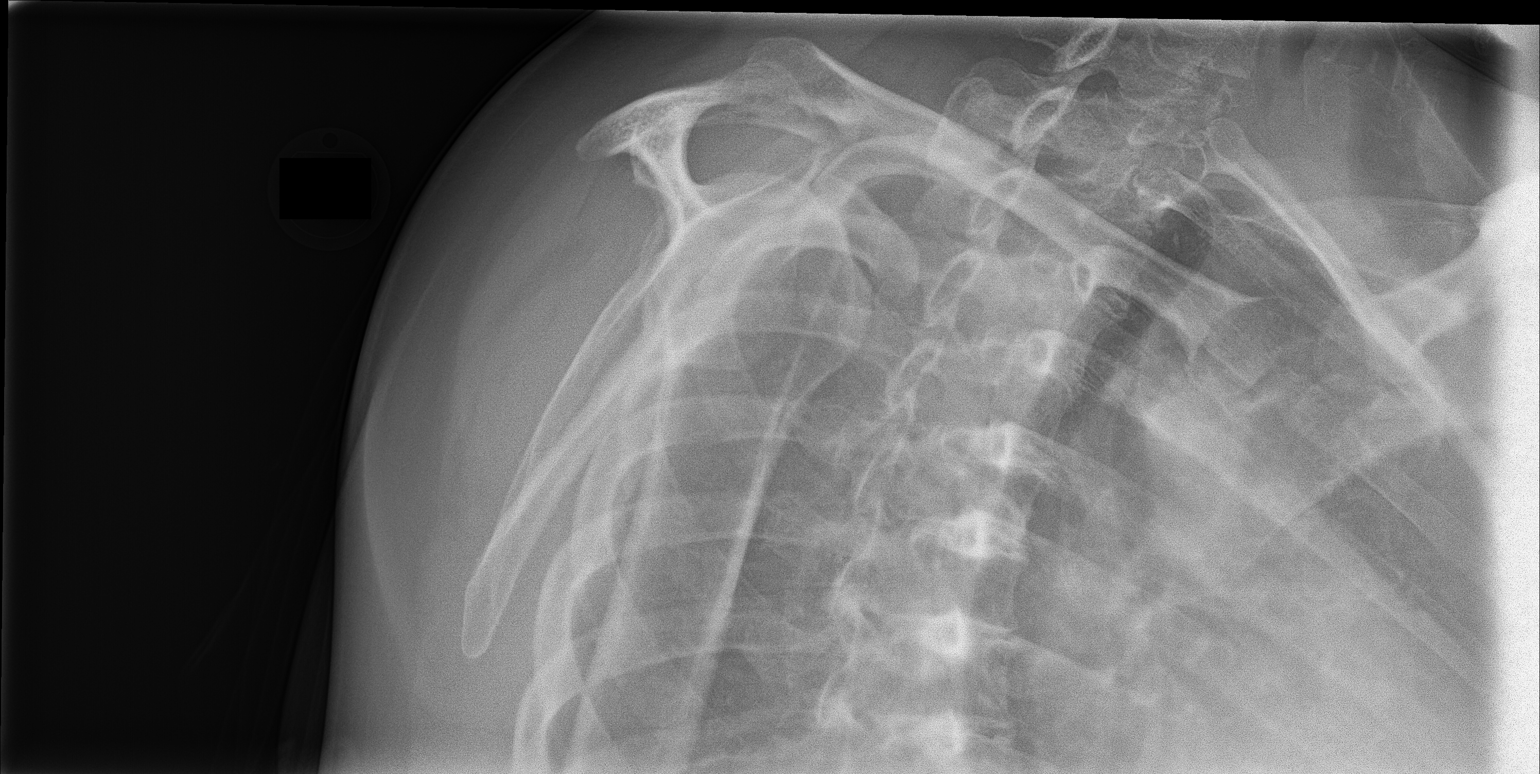

[3 of 3 positions shown; findings below may reference images not displayed]

FINDINGS: No fracture or bone lesion.

The glenohumeral and AC joints are normally spaced and aligned. No
significant arthropathic change.

Soft tissues are unremarkable.
IMPRESSION: Negative.

## 2019-06-18 DIAGNOSIS — M545 Low back pain: Secondary | ICD-10-CM | POA: Diagnosis not present

## 2019-06-23 DIAGNOSIS — M5441 Lumbago with sciatica, right side: Secondary | ICD-10-CM | POA: Diagnosis not present

## 2019-06-23 DIAGNOSIS — M545 Low back pain: Secondary | ICD-10-CM | POA: Diagnosis not present

## 2019-07-07 ENCOUNTER — Encounter: Payer: Self-pay | Admitting: Family

## 2019-07-07 ENCOUNTER — Ambulatory Visit: Payer: BC Managed Care – PPO | Admitting: Family

## 2019-07-07 ENCOUNTER — Other Ambulatory Visit: Payer: Self-pay

## 2019-07-07 VITALS — BP 174/95 | HR 83 | Temp 97.5°F | Ht 64.0 in | Wt 187.4 lb

## 2019-07-07 DIAGNOSIS — Z79899 Other long term (current) drug therapy: Secondary | ICD-10-CM | POA: Insufficient documentation

## 2019-07-07 DIAGNOSIS — I10 Essential (primary) hypertension: Secondary | ICD-10-CM | POA: Diagnosis not present

## 2019-07-07 DIAGNOSIS — G43109 Migraine with aura, not intractable, without status migrainosus: Secondary | ICD-10-CM

## 2019-07-07 DIAGNOSIS — F411 Generalized anxiety disorder: Secondary | ICD-10-CM | POA: Diagnosis not present

## 2019-07-07 DIAGNOSIS — F1320 Sedative, hypnotic or anxiolytic dependence, uncomplicated: Secondary | ICD-10-CM | POA: Insufficient documentation

## 2019-07-07 DIAGNOSIS — Z79891 Long term (current) use of opiate analgesic: Secondary | ICD-10-CM | POA: Diagnosis not present

## 2019-07-07 MED ORDER — ALPRAZOLAM 1 MG PO TABS: 1.0000 mg | ORAL_TABLET | Freq: Two times a day (BID) | ORAL | 5 refills | Status: DC | PRN

## 2019-07-07 MED ORDER — TOPIRAMATE 100 MG PO TABS: 100.0000 mg | ORAL_TABLET | Freq: Two times a day (BID) | ORAL | 2 refills | Status: DC

## 2019-07-07 NOTE — Patient Instructions (Signed)

## 2019-07-07 NOTE — Progress Notes (Signed)
Subjective:    Patient ID: Monica Cross, female    DOB: Feb 15, 1955, 64 y.o.   MRN: [id number]  Chief Complaint  Patient presents with  . Hypertension  . Substance agreement contract   Pt presents to the office today to recheck migraines and GAD. We had a telephone visit on 05/26/19 and we increased her Topamax to 100 mg BID from 25 mg BID. She reports her migraines have improved and states since she has increased to the 100 mg BID she has not had any headaches.   Hypertension This is a chronic problem. The current episode started more than 1 year ago. The problem has been waxing and waning since onset. The problem is uncontrolled. Associated symptoms include anxiety and malaise/fatigue. Pertinent negatives include no peripheral edema or shortness of breath. Risk factors for coronary artery disease include dyslipidemia, obesity and sedentary lifestyle. Past treatments include angiotensin blockers and diuretics. The current treatment provides mild improvement.  Migraine  This is a chronic problem. The current episode started more than 1 year ago. The problem occurs intermittently. The problem has been rapidly improving. The pain is located in the occipital region. The pain quality is similar to prior headaches. Associated symptoms include nausea ("some times"), phonophobia and photophobia. She has tried beta blockers for the symptoms. The treatment provided moderate relief. Her past medical history is significant for hypertension and migraines in the family.  Anxiety Presents for follow-up visit. Symptoms include depressed mood, excessive worry, irritability, nausea ("some times"), nervous/anxious behavior and restlessness. Patient reports no shortness of breath. Symptoms occur most days.        Review of Systems  Constitutional: Positive for irritability and malaise/fatigue.  Eyes: Positive for photophobia.  Respiratory: Negative for shortness of breath.   Gastrointestinal: Positive for  nausea ("some times").  Psychiatric/Behavioral: The patient is nervous/anxious.   All other systems reviewed and are negative.      Objective:   Physical Exam Vitals reviewed.  Constitutional:      General: She is not in acute distress.    Appearance: She is well-developed.  HENT:     Head: Normocephalic and atraumatic.     Right Ear: Tympanic membrane normal.     Left Ear: Tympanic membrane normal.  Eyes:     Pupils: Pupils are equal, round, and reactive to light.  Neck:     Thyroid: No thyromegaly.  Cardiovascular:     Rate and Rhythm: Normal rate and regular rhythm.     Heart sounds: Normal heart sounds. No murmur.  Pulmonary:     Effort: Pulmonary effort is normal. No respiratory distress.     Breath sounds: Normal breath sounds. No wheezing.  Abdominal:     General: Bowel sounds are normal. There is no distension.     Palpations: Abdomen is soft.     Tenderness: There is no abdominal tenderness.  Musculoskeletal:        General: No tenderness. Normal range of motion.     Cervical back: Normal range of motion and neck supple.  Skin:    General: Skin is warm and dry.  Neurological:     Mental Status: She is alert and oriented to person, place, and time.     Cranial Nerves: No cranial nerve deficit.     Deep Tendon Reflexes: Reflexes are normal and symmetric.  Psychiatric:        Behavior: Behavior normal.        Thought Content: Thought content normal.  Judgment: Judgment normal.          BP (!) 174/95   Pulse 83   Temp (!) 97.5 F (36.4 C) (Temporal)   Ht 5\' 4"  (1.626 m)   Wt 187 lb 6.4 oz (85 kg)   SpO2 99%   BMI 32.17 kg/m   Assessment & Plan:  Monica Cross comes in today with chief complaint of Hypertension and Substance agreement contract   Diagnosis and orders addressed:  1. Controlled substance agreement signed Will decrease Xanax to #60 a month. She reports she is only taking 1 1/2 a day at this time. This is a decrease from 3 mg a  day!! Keep up the great work with the goal of tampering off. - ToxASSURE Select 13 (MW), Urine - ALPRAZolam (XANAX) 1 MG tablet; Take 1 tablet (1 mg total) by mouth 2 (two) times daily as needed for anxiety.  Dispense: 60 tablet; Refill: 5  2. Essential hypertension Reports her BP at home is <140/90 If elevated on next visit will increase medicaiton  3. Migraine with aura and without status migrainosus, not intractable Continue Topamax  Limit caffeine and stress  - topiramate (TOPAMAX) 100 MG tablet; Take 1 tablet (100 mg total) by mouth 2 (two) times daily.  Dispense: 180 tablet; Refill: 2  4. Generalized anxiety disorder - ALPRAZolam (XANAX) 1 MG tablet; Take 1 tablet (1 mg total) by mouth 2 (two) times daily as needed for anxiety.  Dispense: 60 tablet; Refill: 5  5. Benzodiazepine dependence ([facility]) - ALPRAZolam (XANAX) 1 MG tablet; Take 1 tablet (1 mg total) by mouth 2 (two) times daily as needed for anxiety.  Dispense: 60 tablet; Refill: 5   Labs pending Health Maintenance reviewed Diet and exercise encouraged  Follow up plan: 6 months    Evelina Dun, FNP

## 2019-07-10 LAB — TOXASSURE SELECT 13 (MW), URINE

## 2019-07-20 DIAGNOSIS — M48061 Spinal stenosis, lumbar region without neurogenic claudication: Secondary | ICD-10-CM | POA: Diagnosis not present

## 2019-08-19 ENCOUNTER — Other Ambulatory Visit: Payer: Self-pay | Admitting: Family Medicine

## 2019-08-19 ENCOUNTER — Encounter: Payer: Self-pay | Admitting: Family

## 2019-08-19 DIAGNOSIS — F411 Generalized anxiety disorder: Secondary | ICD-10-CM

## 2019-09-09 DIAGNOSIS — M48061 Spinal stenosis, lumbar region without neurogenic claudication: Secondary | ICD-10-CM | POA: Diagnosis not present

## 2019-09-25 DIAGNOSIS — M5416 Radiculopathy, lumbar region: Secondary | ICD-10-CM | POA: Diagnosis not present

## 2019-10-22 ENCOUNTER — Telehealth: Payer: Self-pay | Admitting: Family

## 2019-10-22 DIAGNOSIS — M5416 Radiculopathy, lumbar region: Secondary | ICD-10-CM | POA: Diagnosis not present

## 2019-10-22 NOTE — Telephone Encounter (Signed)
Please schedule pt an appointment for follow up.

## 2019-10-23 NOTE — Telephone Encounter (Signed)
Patient aware of recommendation. Appointment scheduled for 09/22 with [place]. Patient only wanted to see Christy. Patient advised if she has any signs of abnormal bleeding she needs to go to ER.

## 2019-11-04 ENCOUNTER — Ambulatory Visit: Payer: BC Managed Care – PPO | Admitting: Family

## 2019-11-04 ENCOUNTER — Encounter: Payer: Self-pay | Admitting: Family

## 2019-11-04 ENCOUNTER — Other Ambulatory Visit: Payer: Self-pay

## 2019-11-04 VITALS — BP 141/78 | HR 89 | Temp 97.7°F | Ht 64.0 in | Wt 185.4 lb

## 2019-11-04 DIAGNOSIS — F411 Generalized anxiety disorder: Secondary | ICD-10-CM

## 2019-11-04 DIAGNOSIS — R7989 Other specified abnormal findings of blood chemistry: Secondary | ICD-10-CM | POA: Diagnosis not present

## 2019-11-04 DIAGNOSIS — Z23 Encounter for immunization: Secondary | ICD-10-CM

## 2019-11-04 DIAGNOSIS — F332 Major depressive disorder, recurrent severe without psychotic features: Secondary | ICD-10-CM

## 2019-11-04 DIAGNOSIS — F1320 Sedative, hypnotic or anxiolytic dependence, uncomplicated: Secondary | ICD-10-CM

## 2019-11-04 MED ORDER — BUPROPION HCL ER (XL) 150 MG PO TB24: 150.0000 mg | ORAL_TABLET | Freq: Every day | ORAL | 1 refills | Status: DC

## 2019-11-04 NOTE — Patient Instructions (Signed)
Living With Depression Everyone experiences occasional disappointment, sadness, and loss in their lives. When you are feeling down, blue, or sad for at least 2 weeks in a row, it may mean that you have depression. Depression can affect your thoughts and feelings, relationships, daily activities, and physical health. It is caused by changes in the way your brain functions. If you receive a diagnosis of depression, your health care provider will tell you which type of depression you have and what treatment options are available to you. If you are living with depression, there are ways to help you recover from it and also ways to prevent it from coming back. How to cope with lifestyle changes Coping with stress     Stress is your body's reaction to life changes and events, both good and bad. Stressful situations may include:  Getting married.  The death of a spouse.  Losing a job.  Retiring.  Having a baby. Stress can last just a few hours or it can be ongoing. Stress can play a major role in depression, so it is important to learn both how to cope with stress and how to think about it differently. Talk with your health care provider or a counselor if you would like to learn more about stress reduction. He or she may suggest some stress reduction techniques, such as:  Music therapy. This can include creating music or listening to music. Choose music that you enjoy and that inspires you.  Mindfulness-based meditation. This kind of meditation can be done while sitting or walking. It involves being aware of your normal breaths, rather than trying to control your breathing.  Centering prayer. This is a kind of meditation that involves focusing on a spiritual word or phrase. Choose a word, phrase, or sacred image that is meaningful to you and that brings you peace.  Deep breathing. To do this, expand your stomach and inhale slowly through your nose. Hold your breath for 3-5 seconds, then exhale  slowly, allowing your stomach muscles to relax.  Muscle relaxation. This involves intentionally tensing muscles then relaxing them. Choose a stress reduction technique that fits your lifestyle and personality. Stress reduction techniques take time and practice to develop. Set aside 5-15 minutes a day to do them. Therapists can offer training in these techniques. The training may be covered by some insurance plans. Other things you can do to manage stress include:  Keeping a stress diary. This can help you learn what triggers your stress and ways to control your response.  Understanding what your limits are and saying no to requests or events that lead to a schedule that is too full.  Thinking about how you respond to certain situations. You may not be able to control everything, but you can control how you react.  Adding humor to your life by watching funny films or TV shows.  Making time for activities that help you relax and not feeling guilty about spending your time this way.  Medicines Your health care provider may suggest certain medicines if he or she feels that they will help improve your condition. Avoid using alcohol and other substances that may prevent your medicines from working properly (may interact). It is also important to:  Talk with your pharmacist or health care provider about all the medicines that you take, their possible side effects, and what medicines are safe to take together.  Make it your goal to take part in all treatment decisions (shared decision-making). This includes giving input on   the side effects of medicines. It is best if shared decision-making with your health care provider is part of your total treatment plan. If your health care provider prescribes a medicine, you may not notice the full benefits of it for 4-8 weeks. Most people who are treated for depression need to be on medicine for at least 6-12 months after they feel better. If you are taking  medicines as part of your treatment, do not stop taking medicines without first talking to your health care provider. You may need to have the medicine slowly decreased (tapered) over time to decrease the risk of harmful side effects. Relationships Your health care provider may suggest family therapy along with individual therapy and drug therapy. While there may not be family problems that are causing you to feel depressed, it is still important to make sure your family learns as much as they can about your mental health. Having your family's support can help make your treatment successful. How to recognize changes in your condition Everyone has a different response to treatment for depression. Recovery from major depression happens when you have not had signs of major depression for two months. This may mean that you will start to:  Have more interest in doing activities.  Feel less hopeless than you did 2 months ago.  Have more energy.  Overeat less often, or have better or improving appetite.  Have better concentration. Your health care provider will work with you to decide the next steps in your recovery. It is also important to recognize when your condition is getting worse. Watch for these signs:  Having fatigue or low energy.  Eating too much or too little.  Sleeping too much or too little.  Feeling restless, agitated, or hopeless.  Having trouble concentrating or making decisions.  Having unexplained physical complaints.  Feeling irritable, angry, or aggressive. Get help as soon as you or your family members notice these symptoms coming back. How to get support and help from others How to talk with friends and family members about your condition  Talking to friends and family members about your condition can provide you with one way to get support and guidance. Reach out to trusted friends or family members, explain your symptoms to them, and let them know that you are  working with a health care provider to treat your depression. Financial resources Not all insurance plans cover mental health care, so it is important to check with your insurance carrier. If paying for co-pays or counseling services is a problem, search for a local or county mental health care center. They may be able to offer public mental health care services at low or no cost when you are not able to see a private health care provider. If you are taking medicine for depression, you may be able to get the generic form, which may be less expensive. Some makers of prescription medicines also offer help to patients who cannot afford the medicines they need. Follow these instructions at home:   Get the right amount and quality of sleep.  Cut down on using caffeine, tobacco, alcohol, and other potentially harmful substances.  Try to exercise, such as walking or lifting small weights.  Take over-the-counter and prescription medicines only as told by your health care provider.  Eat a healthy diet that includes plenty of vegetables, fruits, whole grains, low-fat dairy products, and lean protein. Do not eat a lot of foods that are high in solid fats, added sugars, or salt.    Keep all follow-up visits as told by your health care provider. This is important. Contact a health care provider if:  You stop taking your antidepressant medicines, and you have any of these symptoms: ? Nausea. ? Headache. ? Feeling lightheaded. ? Chills and body aches. ? Not being able to sleep (insomnia).  You or your friends and family think your depression is getting worse. Get help right away if:  You have thoughts of hurting yourself or others. If you ever feel like you may hurt yourself or others, or have thoughts about taking your own life, get help right away. You can go to your nearest emergency department or call:  Your local emergency services (911 in the [place]  A suicide crisis helpline, such as the  National Suicide Prevention Lifeline at 1-800-273-8255. This is open 24-hours a day. Summary  If you are living with depression, there are ways to help you recover from it and also ways to prevent it from coming back.  Work with your health care team to create a management plan that includes counseling, stress management techniques, and healthy lifestyle habits. This information is not intended to replace advice given to you by your health care provider. Make sure you discuss any questions you have with your health care provider. Document Revised: 05/23/2018 Document Reviewed: 01/02/2016 Elsevier Patient Education  2020 [employer] Inc.  

## 2019-11-04 NOTE — Progress Notes (Signed)
Subjective:    Patient ID: Monica Cross, female    DOB: 1955/02/14, 64 y.o.   MRN: [id number]  Chief Complaint  Patient presents with  . Follow-up    PLATLETS ELEVATED. [facility] PA SENT HER OVER, CELEXA NOT WORKING    Pt presents to the office today with elevated platelets. She states her PA at work drew blood and was told to follow up with her PCP. Her plt count was 410.   She states her son had a stroke and she has had increased anxiety and depression.  Depression        This is a chronic problem.  The current episode started more than 1 year ago.   The onset quality is gradual.   The problem occurs intermittently.  Associated symptoms include fatigue, helplessness, hopelessness, irritable, restlessness, decreased interest and sad.     The symptoms are aggravated by family issues.  Past treatments include SSRIs - Selective serotonin reuptake inhibitors.  Past medical history includes anxiety.   Anxiety Presents for follow-up visit. Symptoms include depressed mood, irritability and restlessness. Symptoms occur occasionally. The severity of symptoms is moderate.        Review of Systems  Constitutional: Positive for fatigue and irritability.  Psychiatric/Behavioral: Positive for depression.  All other systems reviewed and are negative.      Objective:   Physical Exam Vitals reviewed.  Constitutional:      General: She is irritable. She is not in acute distress.    Appearance: She is well-developed.  HENT:     Head: Normocephalic and atraumatic.     Right Ear: Tympanic membrane normal.     Left Ear: Tympanic membrane normal.  Eyes:     Pupils: Pupils are equal, round, and reactive to light.  Neck:     Thyroid: No thyromegaly.  Cardiovascular:     Rate and Rhythm: Normal rate and regular rhythm.     Heart sounds: Normal heart sounds. No murmur heard.   Pulmonary:     Effort: Pulmonary effort is normal. No respiratory distress.     Breath sounds: Normal breath sounds. No  wheezing.  Abdominal:     General: Bowel sounds are normal. There is no distension.     Palpations: Abdomen is soft.     Tenderness: There is no abdominal tenderness.  Musculoskeletal:        General: No tenderness. Normal range of motion.     Cervical back: Normal range of motion and neck supple.  Skin:    General: Skin is warm and dry.  Neurological:     Mental Status: She is alert and oriented to person, place, and time.     Cranial Nerves: No cranial nerve deficit.     Deep Tendon Reflexes: Reflexes are normal and symmetric.  Psychiatric:        Behavior: Behavior normal.        Thought Content: Thought content normal.        Judgment: Judgment normal.      BP (!) 141/78   Pulse 89   Temp 97.7 F (36.5 C) (Temporal)   Ht '5\' 4"'  (1.626 m)   Wt 185 lb 6.4 oz (84.1 kg)   SpO2 97%   BMI 31.82 kg/m       Assessment & Plan:  Monica Cross comes in today with chief complaint of Follow-up (PLATLETS ELEVATED. UN[facility] PA SENT HER OVER, CELEXA NOT WORKING )   Diagnosis and orders addressed:  1. Benzodiazepine dependence ([place]) -  CBC with Differential/Platelet - BMP8+EGFR  2. Generalized anxiety disorder Will add Wellbutrin 150 mg today Continue Celexa - buPROPion (WELLBUTRIN XL) 150 MG 24 hr tablet; Take 1 tablet (150 mg total) by mouth daily.  Dispense: 90 tablet; Refill: 1 - CBC with Differential/Platelet - BMP8+EGFR  3. Severe episode of recurrent major depressive disorder, without psychotic features ([place]) - buPROPion (WELLBUTRIN XL) 150 MG 24 hr tablet; Take 1 tablet (150 mg total) by mouth daily.  Dispense: 90 tablet; Refill: 1 - CBC with Differential/Platelet - BMP8+EGFR  4. Elevated platelet count CBC pending If elevated will do hematologists referral   RTO in 4-6 weeks to recheck depression and GAD  Evelina Dun, FNP

## 2019-11-05 ENCOUNTER — Telehealth: Payer: Self-pay | Admitting: Family

## 2019-11-05 LAB — BMP8+EGFR
BUN/Creatinine Ratio: 21 (ref 12–28)
BUN: 17 mg/dL (ref 8–27)
CO2: 27 mmol/L (ref 20–29)
Calcium: 10 mg/dL (ref 8.7–10.3)
Chloride: 103 mmol/L (ref 96–106)
Creatinine, Ser: 0.82 mg/dL (ref 0.57–1.00)
GFR calc Af Amer: 89 mL/min/{1.73_m2} (ref >59)
GFR calc non Af Amer: 77 mL/min/{1.73_m2} (ref >59)
Glucose: 83 mg/dL (ref 65–99)
Potassium: 4.6 mmol/L (ref 3.5–5.2)
Sodium: 139 mmol/L (ref 134–144)

## 2019-11-05 LAB — CBC WITH DIFFERENTIAL/PLATELET
Basophils Absolute: 0 10*3/uL (ref 0.0–0.2)
Basos: 1 %
EOS (ABSOLUTE): 0.2 10*3/uL (ref 0.0–0.4)
Eos: 3 %
Hematocrit: 34.7 % (ref 34.0–46.6)
Hemoglobin: 11.7 g/dL (ref 11.1–15.9)
Immature Grans (Abs): 0 10*3/uL (ref 0.0–0.1)
Immature Granulocytes: 0 %
Lymphocytes Absolute: 2 10*3/uL (ref 0.7–3.1)
Lymphs: 37 %
MCH: 30.7 pg (ref 26.6–33.0)
MCHC: 33.7 g/dL (ref 31.5–35.7)
MCV: 91 fL (ref 79–97)
Monocytes Absolute: 0.4 10*3/uL (ref 0.1–0.9)
Monocytes: 8 %
Neutrophils Absolute: 2.9 10*3/uL (ref 1.4–7.0)
Neutrophils: 51 %
Platelets: 465 10*3/uL — ABNORMAL HIGH (ref 150–450)
RBC: 3.81 x10E6/uL (ref 3.77–5.28)
RDW: 12.8 % (ref 11.7–15.4)
WBC: 5.5 10*3/uL (ref 3.4–10.8)

## 2019-11-05 NOTE — Telephone Encounter (Signed)
Pt called to get lab results. Reviewed results with pt per Christys notes. Pt voiced understanding.

## 2019-12-07 ENCOUNTER — Ambulatory Visit: Payer: BC Managed Care – PPO | Admitting: Nurse Practitioner

## 2019-12-17 ENCOUNTER — Ambulatory Visit: Payer: BC Managed Care – PPO | Admitting: Family

## 2019-12-17 DIAGNOSIS — M1711 Unilateral primary osteoarthritis, right knee: Secondary | ICD-10-CM | POA: Diagnosis not present

## 2020-01-04 ENCOUNTER — Encounter: Payer: Self-pay | Admitting: Family

## 2020-01-04 ENCOUNTER — Ambulatory Visit: Payer: BC Managed Care – PPO | Admitting: Family

## 2020-01-04 ENCOUNTER — Other Ambulatory Visit: Payer: Self-pay

## 2020-01-04 ENCOUNTER — Ambulatory Visit (INDEPENDENT_AMBULATORY_CARE_PROVIDER_SITE_OTHER): Payer: BC Managed Care – PPO

## 2020-01-04 VITALS — BP 130/84 | HR 87 | Temp 97.5°F | Ht 64.0 in | Wt 185.2 lb

## 2020-01-04 DIAGNOSIS — E7800 Pure hypercholesterolemia, unspecified: Secondary | ICD-10-CM

## 2020-01-04 DIAGNOSIS — G4700 Insomnia, unspecified: Secondary | ICD-10-CM

## 2020-01-04 DIAGNOSIS — F411 Generalized anxiety disorder: Secondary | ICD-10-CM | POA: Diagnosis not present

## 2020-01-04 DIAGNOSIS — I10 Essential (primary) hypertension: Secondary | ICD-10-CM | POA: Diagnosis not present

## 2020-01-04 DIAGNOSIS — Z79899 Other long term (current) drug therapy: Secondary | ICD-10-CM

## 2020-01-04 DIAGNOSIS — Z23 Encounter for immunization: Secondary | ICD-10-CM

## 2020-01-04 DIAGNOSIS — F1320 Sedative, hypnotic or anxiolytic dependence, uncomplicated: Secondary | ICD-10-CM | POA: Diagnosis not present

## 2020-01-04 DIAGNOSIS — Z780 Asymptomatic menopausal state: Secondary | ICD-10-CM

## 2020-01-04 DIAGNOSIS — G43109 Migraine with aura, not intractable, without status migrainosus: Secondary | ICD-10-CM

## 2020-01-04 DIAGNOSIS — F332 Major depressive disorder, recurrent severe without psychotic features: Secondary | ICD-10-CM

## 2020-01-04 DIAGNOSIS — Z1211 Encounter for screening for malignant neoplasm of colon: Secondary | ICD-10-CM

## 2020-01-04 DIAGNOSIS — M170 Bilateral primary osteoarthritis of knee: Secondary | ICD-10-CM

## 2020-01-04 MED ORDER — BUSPIRONE HCL 5 MG PO TABS: 5.0000 mg | ORAL_TABLET | Freq: Two times a day (BID) | ORAL | 2 refills | Status: DC

## 2020-01-04 MED ORDER — ALPRAZOLAM 1 MG PO TABS: 1.0000 mg | ORAL_TABLET | Freq: Two times a day (BID) | ORAL | 5 refills | Status: DC | PRN

## 2020-01-04 NOTE — Progress Notes (Signed)
Subjective:    Patient ID: Monica Cross, female    DOB: 30-Jun-1955, 64 y.o.   MRN: [id number]  Chief Complaint  Patient presents with  . Anxiety  . Hypertension   Pt presents to the office today for chronic follow up. She continues to care for her son who had a stroke.  Anxiety Presents for follow-up visit. Symptoms include depressed mood, excessive worry, insomnia, nervous/anxious behavior and restlessness. Patient reports no shortness of breath. Symptoms occur most days. The severity of symptoms is moderate.    Hypertension This is a chronic problem. The current episode started more than 1 year ago. The problem has been waxing and waning since onset. The problem is uncontrolled. Associated symptoms include anxiety and malaise/fatigue. Pertinent negatives include no peripheral edema or shortness of breath. Risk factors for coronary artery disease include dyslipidemia, obesity and sedentary lifestyle. The current treatment provides moderate improvement.  Migraine  This is a chronic problem. The current episode started more than 1 year ago. The problem occurs monthly. The pain quality is similar to prior headaches. Associated symptoms include insomnia, phonophobia and photophobia. The symptoms are aggravated by emotional stress. She has tried darkened room for the symptoms. The treatment provided moderate relief. Her past medical history is significant for hypertension.  Arthritis Presents for follow-up visit. She complains of pain and stiffness. The symptoms have been stable. Affected locations include the left knee, right knee, left shoulder, right shoulder, right hip and left hip. Her pain is at a severity of 10/10.  Depression        This is a chronic problem.  The current episode started more than 1 year ago.   The onset quality is gradual.   The problem occurs intermittently.  Associated symptoms include insomnia, irritable, restlessness and sad.  Past treatments include SSRIs -  Selective serotonin reuptake inhibitors.  Past medical history includes anxiety.   Insomnia Primary symptoms: difficulty falling asleep, malaise/fatigue.  The current episode started more than one year. The onset quality is gradual. The problem occurs intermittently. The problem has been waxing and waning since onset. PMH includes: depression.  Hyperlipidemia This is a chronic problem. The current episode started more than 1 year ago. The problem is controlled. Pertinent negatives include no shortness of breath.      Review of Systems  Constitutional: Positive for malaise/fatigue.  Eyes: Positive for photophobia.  Respiratory: Negative for shortness of breath.   Musculoskeletal: Positive for arthritis and stiffness.  Psychiatric/Behavioral: Positive for depression. The patient is nervous/anxious and has insomnia.   All other systems reviewed and are negative.      Objective:   Physical Exam Vitals reviewed.  Constitutional:      General: She is irritable. She is not in acute distress.    Appearance: She is well-developed.  HENT:     Head: Normocephalic and atraumatic.     Right Ear: Tympanic membrane normal.     Left Ear: Tympanic membrane normal.  Eyes:     Pupils: Pupils are equal, round, and reactive to light.  Neck:     Thyroid: No thyromegaly.  Cardiovascular:     Rate and Rhythm: Normal rate and regular rhythm.     Heart sounds: Normal heart sounds. No murmur heard.   Pulmonary:     Effort: Pulmonary effort is normal. No respiratory distress.     Breath sounds: Normal breath sounds. No wheezing.  Abdominal:     General: Bowel sounds are normal. There is no distension.  Palpations: Abdomen is soft.     Tenderness: There is no abdominal tenderness.  Musculoskeletal:        General: No tenderness. Normal range of motion.     Cervical back: Normal range of motion and neck supple.  Skin:    General: Skin is warm and dry.  Neurological:     Mental Status: She is  alert and oriented to person, place, and time.     Cranial Nerves: No cranial nerve deficit.     Deep Tendon Reflexes: Reflexes are normal and symmetric.  Psychiatric:        Behavior: Behavior normal.        Thought Content: Thought content normal.        Judgment: Judgment normal.      BP (!) 147/84   Pulse 87   Temp (!) 97.5 F (36.4 C) (Temporal)   Ht 5\' 4"  (1.626 m)   Wt 185 lb 3.2 oz (84 kg)   SpO2 100%   BMI 31.79 kg/m      Assessment & Plan:  Monica Cross comes in today with chief complaint of Anxiety and Hypertension   Diagnosis and orders addressed:  1. Controlled substance agreement signed - ALPRAZolam (XANAX) 1 MG tablet; Take 1 tablet (1 mg total) by mouth 2 (two) times daily as needed for anxiety.  Dispense: 60 tablet; Refill: 5  2. Generalized anxiety disorder - ALPRAZolam (XANAX) 1 MG tablet; Take 1 tablet (1 mg total) by mouth 2 (two) times daily as needed for anxiety.  Dispense: 60 tablet; Refill: 5 - busPIRone (BUSPAR) 5 MG tablet; Take 1 tablet (5 mg total) by mouth 2 (two) times daily.  Dispense: 180 tablet; Refill: 2  3. Benzodiazepine dependence ([facility]) - ALPRAZolam (XANAX) 1 MG tablet; Take 1 tablet (1 mg total) by mouth 2 (two) times daily as needed for anxiety.  Dispense: 60 tablet; Refill: 5  4. Primary hypertension  5. Migraine with aura and without status migrainosus, not intractable  6. Primary osteoarthritis of both knees  7. Pure hypercholesterolemia  8. Severe episode of recurrent major depressive disorder, without psychotic features ([place])  9. Insomnia, unspecified type  10. Post-menopausal  - DG [facility] DEXA  11. Colon cancer screening  - Cologuard   Labs reviewed Patient reviewed in [place] controlled database, no flags noted. Contract and drug screen are up to date.  Health Maintenance reviewed Diet and exercise encouraged  Follow up plan: 3 months    Evelina Dun, FNP

## 2020-01-04 NOTE — Patient Instructions (Signed)

## 2020-01-05 DIAGNOSIS — Z780 Asymptomatic menopausal state: Secondary | ICD-10-CM | POA: Diagnosis not present

## 2020-01-11 ENCOUNTER — Ambulatory Visit: Payer: BC Managed Care – PPO | Admitting: Family

## 2020-01-15 ENCOUNTER — Other Ambulatory Visit: Payer: Self-pay | Admitting: Family

## 2020-01-15 DIAGNOSIS — Z79899 Other long term (current) drug therapy: Secondary | ICD-10-CM

## 2020-01-15 DIAGNOSIS — F411 Generalized anxiety disorder: Secondary | ICD-10-CM

## 2020-01-15 DIAGNOSIS — F1320 Sedative, hypnotic or anxiolytic dependence, uncomplicated: Secondary | ICD-10-CM

## 2020-01-21 ENCOUNTER — Telehealth: Payer: Self-pay

## 2020-01-21 DIAGNOSIS — F411 Generalized anxiety disorder: Secondary | ICD-10-CM

## 2020-01-21 MED ORDER — BUSPIRONE HCL 5 MG PO TABS: 5.0000 mg | ORAL_TABLET | Freq: Two times a day (BID) | ORAL | 2 refills | Status: DC

## 2020-01-21 NOTE — Telephone Encounter (Signed)
  Prescription Request  01/21/2020  What is the name of the medication or equipment? Xanax and buspar was sent in mailorder she wants them sent to [facility]  Have you contacted your pharmacy to request a refill? (if applicable) no  Which pharmacy would you like this sent to? [facility]   Patient notified that their request is being sent to the clinical staff for review and that they should receive a response within 2 business days.

## 2020-01-25 ENCOUNTER — Other Ambulatory Visit: Payer: Self-pay | Admitting: Family

## 2020-01-25 DIAGNOSIS — Z79899 Other long term (current) drug therapy: Secondary | ICD-10-CM

## 2020-01-25 DIAGNOSIS — F411 Generalized anxiety disorder: Secondary | ICD-10-CM

## 2020-01-25 DIAGNOSIS — F1320 Sedative, hypnotic or anxiolytic dependence, uncomplicated: Secondary | ICD-10-CM

## 2020-01-27 ENCOUNTER — Other Ambulatory Visit: Payer: Self-pay | Admitting: Family

## 2020-01-27 DIAGNOSIS — M533 Sacrococcygeal disorders, not elsewhere classified: Secondary | ICD-10-CM | POA: Diagnosis not present

## 2020-02-02 ENCOUNTER — Other Ambulatory Visit: Payer: Self-pay | Admitting: *Deleted

## 2020-02-02 DIAGNOSIS — F411 Generalized anxiety disorder: Secondary | ICD-10-CM

## 2020-02-02 DIAGNOSIS — F1320 Sedative, hypnotic or anxiolytic dependence, uncomplicated: Secondary | ICD-10-CM

## 2020-02-02 DIAGNOSIS — Z79899 Other long term (current) drug therapy: Secondary | ICD-10-CM

## 2020-02-02 NOTE — Telephone Encounter (Signed)
Erroneous encounter

## 2020-02-04 DIAGNOSIS — Z1211 Encounter for screening for malignant neoplasm of colon: Secondary | ICD-10-CM | POA: Diagnosis not present

## 2020-02-10 ENCOUNTER — Other Ambulatory Visit: Payer: Self-pay | Admitting: Family

## 2020-02-10 DIAGNOSIS — G8929 Other chronic pain: Secondary | ICD-10-CM

## 2020-02-10 DIAGNOSIS — F5101 Primary insomnia: Secondary | ICD-10-CM

## 2020-02-11 ENCOUNTER — Telehealth: Payer: Self-pay | Admitting: Family

## 2020-03-24 DIAGNOSIS — M25552 Pain in left hip: Secondary | ICD-10-CM | POA: Diagnosis not present

## 2020-03-24 DIAGNOSIS — M25551 Pain in right hip: Secondary | ICD-10-CM | POA: Diagnosis not present

## 2020-03-24 DIAGNOSIS — M545 Low back pain, unspecified: Secondary | ICD-10-CM | POA: Diagnosis not present

## 2020-03-24 DIAGNOSIS — M5441 Lumbago with sciatica, right side: Secondary | ICD-10-CM | POA: Diagnosis not present

## 2020-03-28 ENCOUNTER — Encounter: Payer: Self-pay | Admitting: Family

## 2020-03-28 ENCOUNTER — Other Ambulatory Visit: Payer: Self-pay

## 2020-03-28 ENCOUNTER — Ambulatory Visit: Payer: BC Managed Care – PPO | Admitting: Family

## 2020-03-28 VITALS — BP 122/76 | HR 79 | Temp 97.0°F | Ht 64.0 in | Wt 181.8 lb

## 2020-03-28 DIAGNOSIS — M5136 Other intervertebral disc degeneration, lumbar region: Secondary | ICD-10-CM

## 2020-03-28 DIAGNOSIS — F1320 Sedative, hypnotic or anxiolytic dependence, uncomplicated: Secondary | ICD-10-CM

## 2020-03-28 DIAGNOSIS — I10 Essential (primary) hypertension: Secondary | ICD-10-CM | POA: Diagnosis not present

## 2020-03-28 DIAGNOSIS — Z114 Encounter for screening for human immunodeficiency virus [HIV]: Secondary | ICD-10-CM

## 2020-03-28 DIAGNOSIS — G8929 Other chronic pain: Secondary | ICD-10-CM

## 2020-03-28 DIAGNOSIS — E7800 Pure hypercholesterolemia, unspecified: Secondary | ICD-10-CM

## 2020-03-28 DIAGNOSIS — F411 Generalized anxiety disorder: Secondary | ICD-10-CM

## 2020-03-28 DIAGNOSIS — F321 Major depressive disorder, single episode, moderate: Secondary | ICD-10-CM

## 2020-03-28 DIAGNOSIS — G4700 Insomnia, unspecified: Secondary | ICD-10-CM

## 2020-03-28 DIAGNOSIS — R195 Other fecal abnormalities: Secondary | ICD-10-CM

## 2020-03-28 DIAGNOSIS — Z79899 Other long term (current) drug therapy: Secondary | ICD-10-CM

## 2020-03-28 DIAGNOSIS — M51369 Other intervertebral disc degeneration, lumbar region without mention of lumbar back pain or lower extremity pain: Secondary | ICD-10-CM

## 2020-03-28 DIAGNOSIS — M25511 Pain in right shoulder: Secondary | ICD-10-CM

## 2020-03-28 DIAGNOSIS — M170 Bilateral primary osteoarthritis of knee: Secondary | ICD-10-CM

## 2020-03-28 LAB — COLOGUARD: Cologuard: POSITIVE — AB

## 2020-03-28 MED ORDER — BUSPIRONE HCL 7.5 MG PO TABS: 7.5000 mg | ORAL_TABLET | Freq: Three times a day (TID) | ORAL | 1 refills | Status: DC

## 2020-03-28 NOTE — Patient Instructions (Signed)
[email or web address]">  Generalized Anxiety Disorder, Adult Generalized anxiety disorder (GAD) is a mental health condition. Unlike normal worries, anxiety related to GAD is not triggered by a specific event. These worries do not fade or get better with time. GAD interferes with relationships, work, and school. GAD symptoms can vary from mild to severe. People with severe GAD can have intense waves of anxiety with physical symptoms that are similar to panic attacks. What are the causes? The exact cause of GAD is not known, but the following are believed to have an impact:  Differences in natural brain chemicals.  Genes passed down from parents to children.  Differences in the way threats are perceived.  Development during childhood.  Personality. What increases the risk? The following factors may make you more likely to develop this condition:  Being female.  Having a family history of anxiety disorders.  Being very shy.  Experiencing very stressful life events, such as the death of a loved one.  Having a very stressful family environment. What are the signs or symptoms? People with GAD often worry excessively about many things in their lives, such as their health and family. Symptoms may also include:  Mental and emotional symptoms: ? Worrying excessively about natural disasters. ? Fear of being late. ? Difficulty concentrating. ? Fears that others are judging your performance.  Physical symptoms: ? Fatigue. ? Headaches, muscle tension, muscle twitches, trembling, or feeling shaky. ? Feeling like your heart is pounding or beating very fast. ? Feeling out of breath or like you cannot take a deep breath. ? Having trouble falling asleep or staying asleep, or experiencing restlessness. ? Sweating. ? Nausea, diarrhea, or irritable bowel syndrome (IBS).  Behavioral symptoms: ? Experiencing erratic moods or irritability. ? Avoidance of new situations. ? Avoidance of  people. ? Extreme difficulty making decisions. How is this diagnosed? This condition is diagnosed based on your symptoms and medical history. You will also have a physical exam. Your health care provider may perform tests to rule out other possible causes of your symptoms. To be diagnosed with GAD, a person must have anxiety that:  Is out of his or her control.  Affects several different aspects of his or her life, such as work and relationships.  Causes distress that makes him or her unable to take part in normal activities.  Includes at least three symptoms of GAD, such as restlessness, fatigue, trouble concentrating, irritability, muscle tension, or sleep problems. Before your health care provider can confirm a diagnosis of GAD, these symptoms must be present more days than they are not, and they must last for 6 months or longer. How is this treated? This condition may be treated with:  Medicine. Antidepressant medicine is usually prescribed for long-term daily control. Anti-anxiety medicines may be added in severe cases, especially when panic attacks occur.  Talk therapy (psychotherapy). Certain types of talk therapy can be helpful in treating GAD by providing support, education, and guidance. Options include: ? Cognitive behavioral therapy (CBT). People learn coping skills and self-calming techniques to ease their physical symptoms. They learn to identify unrealistic thoughts and behaviors and to replace them with more appropriate thoughts and behaviors. ? Acceptance and commitment therapy (ACT). This treatment teaches people how to be mindful as a way to cope with unwanted thoughts and feelings. ? Biofeedback. This process trains you to manage your body's response (physiological response) through breathing techniques and relaxation methods. You will work with a therapist while machines are used to monitor your physical   symptoms.  Stress management techniques. These include yoga,  meditation, and exercise. A mental health specialist can help determine which treatment is best for you. Some people see improvement with one type of therapy. However, other people require a combination of therapies.   Follow these instructions at home: Lifestyle  Maintain a consistent routine and schedule.  Anticipate stressful situations. Create a plan, and allow extra time to work with your plan.  Practice stress management or self-calming techniques that you have learned from your therapist or your health care provider. General instructions  Take over-the-counter and prescription medicines only as told by your health care provider.  Understand that you are likely to have setbacks. Accept this and be kind to yourself as you persist to take better care of yourself.  Recognize and accept your accomplishments, even if you judge them as small.  Keep all follow-up visits as told by your health care provider. This is important. Contact a health care provider if:  Your symptoms do not get better.  Your symptoms get worse.  You have signs of depression, such as: ? A persistently sad or irritable mood. ? Loss of enjoyment in activities that used to bring you joy. ? Change in weight or eating. ? Changes in sleeping habits. ? Avoiding friends or family members. ? Loss of energy for normal tasks. ? Feelings of guilt or worthlessness. Get help right away if:  You have serious thoughts about hurting yourself or others. If you ever feel like you may hurt yourself or others, or have thoughts about taking your own life, get help right away. Go to your nearest emergency department or:  Call your local emergency services (911 in the [place]  Call a suicide crisis helpline, such as the National Suicide Prevention Lifeline at 1-800-273-8255. This is open 24 hours a day in the [place]  Text the Crisis Text Line at 741741 (in the [place] Summary  Generalized anxiety disorder (GAD) is a mental  health condition that involves worry that is not triggered by a specific event.  People with GAD often worry excessively about many things in their lives, such as their health and family.  GAD may cause symptoms such as restlessness, trouble concentrating, sleep problems, frequent sweating, nausea, diarrhea, headaches, and trembling or muscle twitching.  A mental health specialist can help determine which treatment is best for you. Some people see improvement with one type of therapy. However, other people require a combination of therapies. This information is not intended to replace advice given to you by your health care provider. Make sure you discuss any questions you have with your health care provider. Document Revised: 11/19/2018 Document Reviewed: 11/19/2018 Elsevier Patient Education  2021 Elsevier Inc.  

## 2020-03-28 NOTE — Progress Notes (Signed)
Subjective:    Patient ID: Monica Cross, female    DOB: 07/13/1955, 65 y.o.   MRN: [id number]  Chief Complaint  Patient presents with  . Anxiety    W[facility]. Patient states she done cologaurd in December and has not heard anything from it. Printed and put on room door. Pt positive   . Hypertension    Pt presents to the office today for chronic follow up. She continues to care for her son who had a stroke.   Her cologuard was positive on 02/04/20. She has not followed up with GI yet.  Anxiety Presents for follow-up visit. Symptoms include depressed mood, excessive worry, insomnia, irritability, palpitations and restlessness. Patient reports no shortness of breath. Symptoms occur most days. The severity of symptoms is moderate.    Hypertension This is a chronic problem. The current episode started more than 1 year ago. The problem has been resolved since onset. The problem is controlled. Associated symptoms include anxiety and palpitations. Pertinent negatives include no malaise/fatigue, peripheral edema or shortness of breath. Risk factors for coronary artery disease include dyslipidemia, obesity and sedentary lifestyle. The current treatment provides moderate improvement.  Hyperlipidemia This is a chronic problem. The current episode started more than 1 year ago. Pertinent negatives include no shortness of breath. Current antihyperlipidemic treatment includes statins. The current treatment provides moderate improvement of lipids. Risk factors for coronary artery disease include dyslipidemia, hypertension and a sedentary lifestyle.  Depression        This is a chronic problem.  The current episode started more than 1 year ago.   The onset quality is gradual.   The problem occurs intermittently.  Associated symptoms include insomnia, irritable, restlessness, decreased interest and sad.  Associated symptoms include no helplessness and no hopelessness.  Past treatments  include SSRIs - Selective serotonin reuptake inhibitors.  Past medical history includes anxiety.   Arthritis Presents for follow-up visit. She complains of pain and stiffness. Affected locations include the right hip and left hip. Her pain is at a severity of 8/10.  Insomnia Primary symptoms: no malaise/fatigue.  PMH includes: depression.      Review of Systems  Constitutional: Positive for irritability. Negative for malaise/fatigue.  Respiratory: Negative for shortness of breath.   Cardiovascular: Positive for palpitations.  Musculoskeletal: Positive for arthritis and stiffness.  Psychiatric/Behavioral: Positive for depression. The patient has insomnia.   All other systems reviewed and are negative.      Objective:   Physical Exam Vitals reviewed.  Constitutional:      General: She is irritable. She is not in acute distress.    Appearance: She is well-developed and well-nourished.  HENT:     Head: Normocephalic and atraumatic.     Right Ear: Tympanic membrane normal.     Left Ear: Tympanic membrane normal.     Mouth/Throat:     Mouth: Oropharynx is clear and moist.  Eyes:     Pupils: Pupils are equal, round, and reactive to light.  Neck:     Thyroid: No thyromegaly.  Cardiovascular:     Rate and Rhythm: Normal rate and regular rhythm.     Pulses: Intact distal pulses.     Heart sounds: Normal heart sounds. No murmur heard.   Pulmonary:     Effort: Pulmonary effort is normal. No respiratory distress.     Breath sounds: Normal breath sounds. No wheezing.  Abdominal:     General: Bowel sounds are normal. There is no distension.  Palpations: Abdomen is soft.     Tenderness: There is no abdominal tenderness.  Musculoskeletal:        General: No tenderness or edema. Normal range of motion.     Cervical back: Normal range of motion and neck supple.  Skin:    General: Skin is warm and dry.  Neurological:     Mental Status: She is alert and oriented to person, place,  and time.     Cranial Nerves: No cranial nerve deficit.     Deep Tendon Reflexes: Reflexes are normal and symmetric.  Psychiatric:        Mood and Affect: Mood and affect normal.        Behavior: Behavior normal.        Thought Content: Thought content normal.        Judgment: Judgment normal.       BP 122/76   Pulse 79   Temp (!) 97 F (36.1 C) (Temporal)   Ht '5\' 4"'  (1.626 m)   Wt 181 lb 12.8 oz (82.5 kg)   BMI 31.21 kg/m      Assessment & Plan:  Monica Cross comes in today with chief complaint of Anxiety ([facility]. Patient states she done cologaurd in December and has not heard anything from it. Printed and put on room door. Pt positive ) and Hypertension   Diagnosis and orders addressed:  1. Controlled substance agreement signed - CMP14+EGFR - CBC with Differential/Platelet  2. Generalized anxiety disorder - Will increase Buspar to 7.5 mg from 5 mg  Stress management discussed - busPIRone (BUSPAR) 7.5 MG tablet; Take 1 tablet (7.5 mg total) by mouth 3 (three) times daily.  Dispense: 90 tablet; Refill: 1 - CMP14+EGFR - CBC with Differential/Platelet  3. Benzodiazepine dependence (HCC)  - CMP14+EGFR - CBC with Differential/Platelet  4. Primary hypertension - CMP14+EGFR - CBC with Differential/Platelet  5. DDD (degenerative disc disease), lumbar  - CMP14+EGFR - CBC with Differential/Platelet  6. Primary osteoarthritis of both knees - CMP14+EGFR - CBC with Differential/Platelet  7. Insomnia, unspecified type - CMP14+EGFR - CBC with Differential/Platelet  8. Pure hypercholesterolemia - CMP14+EGFR - CBC with Differential/Platelet  9. Chronic right shoulder pain - CMP14+EGFR - CBC with Differential/Platelet  10. Depression, major, single episode, moderate (HCC) - CMP14+EGFR - CBC with Differential/Platelet  11. Positive colorectal cancer screening using Cologuard test - Ambulatory referral to Gastroenterology -  CMP14+EGFR - CBC with Differential/Platelet  12. Encounter for screening for HIV - HIV Antibody (routine testing w rflx)   Labs pending Health Maintenance reviewed Diet and exercise encouraged  Follow up plan: 6 months and schedule as pap  Evelina Dun, FNP

## 2020-04-05 ENCOUNTER — Ambulatory Visit: Payer: BC Managed Care – PPO | Admitting: Family

## 2020-04-14 ENCOUNTER — Other Ambulatory Visit: Payer: Self-pay | Admitting: Family

## 2020-04-14 DIAGNOSIS — M5441 Lumbago with sciatica, right side: Secondary | ICD-10-CM | POA: Diagnosis not present

## 2020-04-14 DIAGNOSIS — M25551 Pain in right hip: Secondary | ICD-10-CM | POA: Diagnosis not present

## 2020-04-14 DIAGNOSIS — M545 Low back pain, unspecified: Secondary | ICD-10-CM | POA: Diagnosis not present

## 2020-04-14 DIAGNOSIS — M25561 Pain in right knee: Secondary | ICD-10-CM | POA: Diagnosis not present

## 2020-04-19 ENCOUNTER — Other Ambulatory Visit: Payer: Self-pay | Admitting: Family

## 2020-04-19 DIAGNOSIS — D691 Qualitative platelet defects: Secondary | ICD-10-CM

## 2020-04-28 ENCOUNTER — Telehealth: Payer: Self-pay | Admitting: *Deleted

## 2020-04-28 NOTE — Telephone Encounter (Signed)
Closed referral due to lack of receiving lab documents in a timely order.

## 2020-05-04 ENCOUNTER — Other Ambulatory Visit: Payer: Self-pay | Admitting: Family

## 2020-05-06 ENCOUNTER — Ambulatory Visit: Payer: Self-pay | Admitting: Gastroenterology

## 2020-05-19 DIAGNOSIS — M25561 Pain in right knee: Secondary | ICD-10-CM | POA: Diagnosis not present

## 2020-05-19 DIAGNOSIS — M25551 Pain in right hip: Secondary | ICD-10-CM | POA: Diagnosis not present

## 2020-05-19 DIAGNOSIS — M5441 Lumbago with sciatica, right side: Secondary | ICD-10-CM | POA: Diagnosis not present

## 2020-05-19 DIAGNOSIS — M545 Low back pain, unspecified: Secondary | ICD-10-CM | POA: Diagnosis not present

## 2020-05-21 ENCOUNTER — Encounter: Payer: Self-pay | Admitting: Gastroenterology

## 2020-05-21 NOTE — Progress Notes (Signed)
Referring Provider: Sharion Balloon, FNP Primary Care Physician:  Monica Balloon, FNP Primary Gastroenterologist:  Dr. Gala Cross  Chief Complaint  Patient presents with  . Consult    TCS done 10-12 years ago. Positive cologuard.    HPI:   Monica Cross is a 65 y.o. female presenting today at the request of Monica Balloon, FNP for consult colonoscopy due to positive Cologuard test.  Cologuard positive 02/04/2020.  Labs on 02/22/2020 with iron 39 (L), iron saturation 10% (L). Hemoglobin 13.3 with normocytic indices.  Platelets elevated at 591.   Today:  Last colonoscopy 10 years ago at [facility]. No known polyps. No brbpr. Stools were dark/green in color 2 weeks ago after starting oral iron. Stopped iron and stools returned to normal.   Took ibuprofen and a BC powder last week after she fell and injured her knee at work.  Usually, she does not take any NSAID products.    Intermittent constipation. Once a week, she will have hard stools. Otherwise, stools are soft and formed. She will take Exlax about once a week. No diarrhea. No abdominal pain.   No family history of colon cancer.   Denies GERD, dysphagia, nausea, or vomiting.  Past Medical History:  Diagnosis Date  . Anxiety   . Arthritis    Left and right hip  . Carpal tunnel syndrome    Bilat  . Depression   . Headache   . Hyperlipidemia   . Hypertension   . Insomnia    patient denies  . Osteoporosis    patient denies  . Vision abnormalities     Past Surgical History:  Procedure Laterality Date  . CARPAL TUNNEL RELEASE    . CESAREAN SECTION    . COSMETIC SURGERY      Current Outpatient Medications  Medication Sig Dispense Refill  . atorvastatin (LIPITOR) 20 MG tablet Take 1 tablet (20 mg total) by mouth daily. 90 tablet 3  . citalopram (CELEXA) 40 MG tablet Take 1 tablet (40 mg total) by mouth daily. 90 tablet 3  . fenofibrate (TRICOR) 145 MG tablet Take 1 tablet (145 mg total) by mouth daily. 90 tablet 3   . SUMAtriptan (IMITREX) 50 MG tablet TAKE ONE TABLET BY MOUTH every TWO hours as needed FOR migraine - MAY REPEAT in TWO HOURS if headache persists OR recurs 9 tablet 5  . telmisartan-hydrochlorothiazide (MICARDIS HCT) 80-25 MG tablet Take 1 tablet by mouth daily. 30 tablet 3  . tiZANidine (ZANAFLEX) 4 MG tablet Take 1 tablet by mouth every eight hours as needed for spasm.    . Vitamin D, Ergocalciferol, (DRISDOL) 1.25 MG (50000 UNIT) CAPS capsule Take 50,000 Units by mouth once a week.     No current facility-administered medications for this visit.    Allergies as of 05/23/2020  . (No Known Allergies)    Family History  Problem Relation Age of Onset  . Heart disease Mother   . Hypertension Mother   . Other Father        head injury  . Stroke Sister   . Osteoporosis Sister   . Lung cancer Brother   . Liver cancer Brother   . Breast cancer Sister   . Osteoporosis Sister   . COPD Sister   . Osteoporosis Sister   . Healthy Sister   . Osteoporosis Sister   . Other Sister        benign brain tumor  . Osteoporosis Sister   . Colon cancer Neg  Hx     Social History   Socioeconomic History  . Marital status: Single    Spouse name: Not on file  . Number of children: Not on file  . Years of education: Not on file  . Highest education level: Not on file  Occupational History  . Not on file  Tobacco Use  . Smoking status: Never Smoker  . Smokeless tobacco: Never Used  Vaping Use  . Vaping Use: Never used  Substance and Sexual Activity  . Alcohol use: No  . Drug use: No  . Sexual activity: Not on file  Other Topics Concern  . Not on file  Social History Narrative  . Not on file   Social Determinants of Health   Financial Resource Strain: Not on file  Food Insecurity: Not on file  Transportation Needs: Not on file  Physical Activity: Not on file  Stress: Not on file  Social Connections: Not on file  Intimate Partner Violence: Not on file    Review of  Systems: Gen: Denies any fever, chills, cold or flulike symptoms, lightheadedness, dizziness, presyncope, syncope CV: Denies chest pain or heart palpitations Resp: Denies shortness of breath or cough GI: See HPI GU : Denies urinary burning, urinary frequency, urinary hesitancy MS: Right knee pain Derm: Denies rash Psych: History of depression well managed with medications. Heme: See HPI  Physical Exam: BP (!) 150/90   Pulse 89   Temp (!) 97 F (36.1 C)   Ht 5\' 4"  (1.626 m)   Wt 182 lb 3.2 oz (82.6 kg)   BMI 31.27 kg/m  General:   Alert and oriented. Pleasant and cooperative. Well-nourished and well-developed.  Head:  Normocephalic and atraumatic. Eyes:  Without icterus, sclera clear and conjunctiva pink.  Ears:  Normal auditory acuity. Lungs:  Clear to auscultation bilaterally. No wheezes, rales, or rhonchi. No distress.  Heart:  S1, S2 present without murmurs appreciated.  Abdomen:  +BS. Abdomen is round and protuberant but soft and non-tender. No HSM noted. No guarding or rebound. No masses appreciated.  Rectal:  Deferred  Msk:  Symmetrical without gross deformities. Normal posture. Extremities:  Without edema. Neurologic:  Alert and  oriented x4;  grossly normal neurologically. Skin:  Intact without significant lesions or rashes. Psych:  Normal mood and affect.   Assessment: 65 year old female with history of HTN, HLD, depression, anxiety, and arthritis presenting today at the request of Monica Dun, FNP for positive Cologuard completed 02/04/2020.  Additionally, per chart review, patient had low iron and iron saturation at 39 and 10% respectively in January 2022.  Hemoglobin remained within normal limits at 13.3 at that time.  Patient reports last colonoscopy about 10 years ago at [facility] without colon polyps.  She denies bright red blood per rectum.  Admits to dark green stools 2 weeks ago after taking iron for about 1 week.  After discontinuing iron, stools have returned  to normal color.  Denies abdominal pain or unintentional weight loss.  She does report intermittent constipation with hard stools about once a week using Ex-Lax as needed.  No significant upper GI symptoms.  No family history of colon cancer.  Denies any routine use of NSAIDs though she has taken ibuprofen and a BC powder recently due to falling at work and injuring her knee.  She is going to need a colonoscopy for further evaluation of positive Cologuard.  Discussed possible EGD in the setting of low iron.  After further discussion, we will plan to update  labs and pursue colonoscopy only at this time.  If persistent iron deficiency and no significant findings on colonoscopy, consider moving forward with EGD in the future.  For constipation, we will start her on Benefiber daily and use MiraLAX if needed.   Plan: 1.  CBC and iron panel.  2.  Continue to hold iron for now.  3.  Proceed with colonoscopy with propofol with Dr. Gala Cross in the near future. The risks, benefits, and alternatives have been discussed with the patient in detail. The patient states understanding and desires to proceed.  ASA II  4.  Start Benefiber 3 teaspoons daily x2 weeks then increase to twice daily.  5.  Add MiraLAX 1 capful (17 g) daily in 8 ounces of water if Benefiber alone does not control constipation.  6.  Discussed the importance of avoiding NSAIDs.  7.  Follow-up after procedures.    Aliene Altes, PA-C [facility] 05/23/2020

## 2020-05-23 ENCOUNTER — Ambulatory Visit (INDEPENDENT_AMBULATORY_CARE_PROVIDER_SITE_OTHER): Payer: BC Managed Care – PPO | Admitting: Gastroenterology

## 2020-05-23 ENCOUNTER — Other Ambulatory Visit: Payer: Self-pay

## 2020-05-23 ENCOUNTER — Encounter: Payer: Self-pay | Admitting: Gastroenterology

## 2020-05-23 VITALS — BP 150/90 | HR 89 | Temp 97.0°F | Ht 64.0 in | Wt 182.2 lb

## 2020-05-23 DIAGNOSIS — R195 Other fecal abnormalities: Secondary | ICD-10-CM | POA: Diagnosis not present

## 2020-05-23 DIAGNOSIS — K5900 Constipation, unspecified: Secondary | ICD-10-CM

## 2020-05-23 DIAGNOSIS — E611 Iron deficiency: Secondary | ICD-10-CM

## 2020-05-23 MED ORDER — PEG 3350-KCL-NA BICARB-NACL 420 G PO SOLR: 4000.0000 mL | ORAL | 0 refills | Status: DC

## 2020-05-23 NOTE — Progress Notes (Signed)
Cc'ed to pcp °

## 2020-05-23 NOTE — Patient Instructions (Addendum)
Please have labs completed at [facility].  This is on the second floor of the brick building across from [facility] emergency department. [place], [place], [place] [address]  Continue to hold off on iron for now.  We will call you with lab results and further recommendations at that time.  We will arrange for you to have a colonoscopy in the near future with Dr. Gala Romney.  For constipation: Start Benefiber 3 teaspoons daily x2 weeks then increase to twice daily. If Benefiber does not allow you to have soft, formed BMs daily, add MiraLAX 1 capful (17 g) daily in 8 ounces of water.  We will plan to follow-up with you in the office after your procedures.  Do not hesitate to call if you have any questions or concerns prior to your next visit.   It was a pleasure to meet you today!   Aliene Altes, PA-C [facility]

## 2020-06-03 ENCOUNTER — Ambulatory Visit: Payer: BC Managed Care – PPO | Admitting: Family Medicine

## 2020-06-03 ENCOUNTER — Encounter: Payer: Self-pay | Admitting: Family Medicine

## 2020-06-03 ENCOUNTER — Other Ambulatory Visit: Payer: Self-pay

## 2020-06-03 VITALS — BP 142/89 | HR 98 | Temp 98.2°F | Ht 64.0 in | Wt 183.1 lb

## 2020-06-03 DIAGNOSIS — Z79899 Other long term (current) drug therapy: Secondary | ICD-10-CM | POA: Diagnosis not present

## 2020-06-03 DIAGNOSIS — M5136 Other intervertebral disc degeneration, lumbar region: Secondary | ICD-10-CM | POA: Diagnosis not present

## 2020-06-03 DIAGNOSIS — I10 Essential (primary) hypertension: Secondary | ICD-10-CM | POA: Diagnosis not present

## 2020-06-03 DIAGNOSIS — F411 Generalized anxiety disorder: Secondary | ICD-10-CM | POA: Diagnosis not present

## 2020-06-03 DIAGNOSIS — G8929 Other chronic pain: Secondary | ICD-10-CM

## 2020-06-03 DIAGNOSIS — M5441 Lumbago with sciatica, right side: Secondary | ICD-10-CM

## 2020-06-03 DIAGNOSIS — F1320 Sedative, hypnotic or anxiolytic dependence, uncomplicated: Secondary | ICD-10-CM | POA: Diagnosis not present

## 2020-06-03 DIAGNOSIS — M5442 Lumbago with sciatica, left side: Secondary | ICD-10-CM

## 2020-06-03 MED ORDER — TIZANIDINE HCL 4 MG PO TABS: 4.0000 mg | ORAL_TABLET | Freq: Three times a day (TID) | ORAL | 0 refills | Status: DC | PRN

## 2020-06-03 NOTE — Patient Instructions (Signed)

## 2020-06-03 NOTE — Progress Notes (Signed)
Acute Office Visit  Subjective:    Patient ID: Monica Cross, female    DOB: Jul 06, 1955, 65 y.o.   MRN: [id number]  Chief Complaint  Patient presents with  . Leg Pain  . Back Pain    HPI Patient is in today for chronic low back pain with bilateral sciatica. She reports that the pain is worse for the last few days. It is moderate to severe. She has difficulty walking at baseline, but it seems worse now. She has had a about 5 falls this due to the pain. She see Dr. Dorna Leitz at [facility] ortho. . She has taken hydrocodone, tylenol, advil for the pain. Her last appointment with ortho was a few weeks ago, she had an injection then. Deneis changes in bowel or bladder control, or saddle anesthesia. Her next appointment with ortho is on May 5th. She missed work today because of the pain. She is afraid of falling while at work. She has a manual job.   Past Medical History:  Diagnosis Date  . Anxiety   . Arthritis    Left and right hip  . Carpal tunnel syndrome    Bilat  . Depression   . Headache   . Hyperlipidemia   . Hypertension   . Insomnia    patient denies  . Osteoporosis    patient denies  . Vision abnormalities     Past Surgical History:  Procedure Laterality Date  . CARPAL TUNNEL RELEASE    . CESAREAN SECTION    . COSMETIC SURGERY      Family History  Problem Relation Age of Onset  . Heart disease Mother   . Hypertension Mother   . Other Father        head injury  . Stroke Sister   . Osteoporosis Sister   . Lung cancer Brother   . Liver cancer Brother   . Breast cancer Sister   . Osteoporosis Sister   . COPD Sister   . Osteoporosis Sister   . Healthy Sister   . Osteoporosis Sister   . Other Sister        benign brain tumor  . Osteoporosis Sister   . Colon cancer Neg Hx     Social History   Socioeconomic History  . Marital status: Single    Spouse name: Not on file  . Number of children: Not on file  . Years of education: Not on file  . Highest  education level: Not on file  Occupational History  . Not on file  Tobacco Use  . Smoking status: Never Smoker  . Smokeless tobacco: Never Used  Vaping Use  . Vaping Use: Never used  Substance and Sexual Activity  . Alcohol use: No  . Drug use: No  . Sexual activity: Not on file  Other Topics Concern  . Not on file  Social History Narrative  . Not on file   Social Determinants of Health   Financial Resource Strain: Not on file  Food Insecurity: Not on file  Transportation Needs: Not on file  Physical Activity: Not on file  Stress: Not on file  Social Connections: Not on file  Intimate Partner Violence: Not on file    Outpatient Medications Prior to Visit  Medication Sig Dispense Refill  . atorvastatin (LIPITOR) 20 MG tablet Take 1 tablet (20 mg total) by mouth daily. 90 tablet 3  . citalopram (CELEXA) 40 MG tablet Take 1 tablet (40 mg total) by mouth daily. 90 tablet 3  .  fenofibrate (TRICOR) 145 MG tablet Take 1 tablet (145 mg total) by mouth daily. 90 tablet 3  . HYDROcodone-acetaminophen (NORCO/VICODIN) 5-325 MG tablet Take 1 tablet by mouth every 8 (eight) hours as needed.    . SUMAtriptan (IMITREX) 50 MG tablet TAKE ONE TABLET BY MOUTH every TWO hours as needed FOR migraine - MAY REPEAT in TWO HOURS if headache persists OR recurs 9 tablet 5  . telmisartan-hydrochlorothiazide (MICARDIS HCT) 80-25 MG tablet Take 1 tablet by mouth daily. 30 tablet 3  . Vitamin D, Ergocalciferol, (DRISDOL) 1.25 MG (50000 UNIT) CAPS capsule Take 50,000 Units by mouth once a week.    . polyethylene glycol-electrolytes (TRILYTE) 420 g solution Take 4,000 mLs by mouth as directed. (Patient not taking: Reported on 06/03/2020) 4000 mL 0  . tiZANidine (ZANAFLEX) 4 MG tablet Take 1 tablet by mouth every eight hours as needed for spasm. (Patient not taking: Reported on 06/03/2020)     No facility-administered medications prior to visit.    No Known Allergies  Review of Systems As per HPI.      Objective:    Physical Exam Vitals and nursing note reviewed.  Constitutional:      General: She is not in acute distress.    Appearance: She is not ill-appearing, toxic-appearing or diaphoretic.  Musculoskeletal:     Lumbar back: Tenderness present. No swelling, edema, deformity or bony tenderness. Positive right straight leg raise test and positive left straight leg raise test.     Right lower leg: No edema.     Left lower leg: No edema.  Skin:    General: Skin is warm and dry.  Neurological:     General: No focal deficit present.     Mental Status: She is alert and oriented to person, place, and time.     Sensory: Sensory deficit present.     Motor: Weakness (generalized) present.     Gait: Gait abnormal (antalgic).  Psychiatric:        Mood and Affect: Mood normal.        Behavior: Behavior normal.     BP (!) 142/89   Pulse 98   Temp 98.2 F (36.8 C) (Temporal)   Ht 5\' 4"  (1.626 m)   Wt 183 lb 2 oz (83.1 kg)   BMI 31.43 kg/m  Wt Readings from Last 3 Encounters:  06/03/20 183 lb 2 oz (83.1 kg)  05/23/20 182 lb 3.2 oz (82.6 kg)  03/28/20 181 lb 12.8 oz (82.5 kg)    Health Maintenance Due  Topic Date Due  . COVID-19 Vaccine (3 - Moderna risk 4-dose series) 08/03/2019  . PAP SMEAR-Modifier  10/11/2019    There are no preventive care reminders to display for this patient.   No results found for: TSH Lab Results  Component Value Date   WBC 5.5 11/04/2019   HGB 11.7 11/04/2019   HCT 34.7 11/04/2019   MCV 91 11/04/2019   PLT 465 (H) 11/04/2019   Lab Results  Component Value Date   NA 139 11/04/2019   K 4.6 11/04/2019   CO2 27 11/04/2019   GLUCOSE 83 11/04/2019   BUN 17 11/04/2019   CREATININE 0.82 11/04/2019   CALCIUM 10.0 11/04/2019   No results found for: CHOL No results found for: HDL No results found for: LDLCALC No results found for: TRIG No results found for: CHOLHDL No results found for: HGBA1C     Assessment & Plan:   Monica Cross was seen  today for leg pain and  back pain.  Diagnoses and all orders for this visit:  Chronic bilateral low back pain with bilateral sciatica Zanaflex refilled. Work note provided. Heat, ice, rest. Discussed unable to refill hydrocodone as Dr. Berenice Primas has been prescribing this. She also has a CSA with her PCP. Patient reports that ortho plans to schedule MRI for her. She will follow up with ortho to discuss her worsening symptoms.  -     tiZANidine (ZANAFLEX) 4 MG tablet; Take 1 tablet (4 mg total) by mouth every 8 (eight) hours as needed for muscle spasms.  The patient indicates understanding of these issues and agrees with the plan.   Gwenlyn Perking, FNP

## 2020-06-05 LAB — CMP14+EGFR
ALT: 63 IU/L — ABNORMAL HIGH (ref 0–32)
AST: 32 IU/L (ref 0–40)
Albumin/Globulin Ratio: 1.6 (ref 1.2–2.2)
Albumin: 4.1 g/dL (ref 3.8–4.8)
Alkaline Phosphatase: 46 IU/L (ref 44–121)
BUN/Creatinine Ratio: 24 (ref 12–28)
BUN: 18 mg/dL (ref 8–27)
Bilirubin Total: 0.2 mg/dL (ref 0.0–1.2)
CO2: 23 mmol/L (ref 20–29)
Calcium: 9.9 mg/dL (ref 8.7–10.3)
Chloride: 103 mmol/L (ref 96–106)
Creatinine, Ser: 0.75 mg/dL (ref 0.57–1.00)
Globulin, Total: 2.5 g/dL (ref 1.5–4.5)
Glucose: 93 mg/dL (ref 65–99)
Potassium: 4.7 mmol/L (ref 3.5–5.2)
Sodium: 141 mmol/L (ref 134–144)
Total Protein: 6.6 g/dL (ref 6.0–8.5)
eGFR: 90 mL/min/{1.73_m2} (ref >59)

## 2020-06-05 LAB — CBC WITH DIFFERENTIAL/PLATELET
Basophils Absolute: 0.1 10*3/uL (ref 0.0–0.2)
Basos: 1 %
EOS (ABSOLUTE): 0.2 10*3/uL (ref 0.0–0.4)
Eos: 2 %
Hematocrit: 36.5 % (ref 34.0–46.6)
Hemoglobin: 12 g/dL (ref 11.1–15.9)
Immature Grans (Abs): 0 10*3/uL (ref 0.0–0.1)
Immature Granulocytes: 0 %
Lymphocytes Absolute: 1.9 10*3/uL (ref 0.7–3.1)
Lymphs: 30 %
MCH: 31.3 pg (ref 26.6–33.0)
MCHC: 32.9 g/dL (ref 31.5–35.7)
MCV: 95 fL (ref 79–97)
Monocytes Absolute: 0.5 10*3/uL (ref 0.1–0.9)
Monocytes: 7 %
Neutrophils Absolute: 3.8 10*3/uL (ref 1.4–7.0)
Neutrophils: 60 %
Platelets: 492 10*3/uL — ABNORMAL HIGH (ref 150–450)
RBC: 3.83 x10E6/uL (ref 3.77–5.28)
RDW: 12.8 % (ref 11.7–15.4)
WBC: 6.4 10*3/uL (ref 3.4–10.8)

## 2020-06-05 LAB — HIV ANTIBODY (ROUTINE TESTING W REFLEX): HIV Screen 4th Generation wRfx: NONREACTIVE

## 2020-06-08 ENCOUNTER — Telehealth: Payer: Self-pay

## 2020-06-08 NOTE — Telephone Encounter (Signed)
I provided a note for work through 06/13/20 at her last visit until her next appointment with ortho.

## 2020-06-08 NOTE — Telephone Encounter (Signed)
Spoke with patient, her visit with her ortho doctor is on 06/16/20 and the work note we gave her has her going back on 06/13/20.  She would like to know if we can give her a note to be out until 06/16/20 when she sees her ortho doc.  Please advise.

## 2020-06-08 NOTE — Telephone Encounter (Signed)
Pt needs another note to be out of 2 more weeks. Please call back when ready

## 2020-06-08 NOTE — Telephone Encounter (Signed)
Yes, ok to provide note until 06/16/20.

## 2020-06-08 NOTE — Telephone Encounter (Signed)
Note completed, placed at front, patient aware.

## 2020-06-13 ENCOUNTER — Encounter: Payer: Self-pay | Admitting: Family Medicine

## 2020-06-16 DIAGNOSIS — M1712 Unilateral primary osteoarthritis, left knee: Secondary | ICD-10-CM | POA: Diagnosis not present

## 2020-06-16 DIAGNOSIS — M1711 Unilateral primary osteoarthritis, right knee: Secondary | ICD-10-CM | POA: Diagnosis not present

## 2020-06-16 DIAGNOSIS — M545 Low back pain, unspecified: Secondary | ICD-10-CM | POA: Diagnosis not present

## 2020-06-29 ENCOUNTER — Telehealth: Payer: Self-pay | Admitting: Internal Medicine

## 2020-06-29 NOTE — Telephone Encounter (Signed)
Called pt, informed her we will call to reschedule TCS when Dr. Roseanne Kaufman next schedule is available.

## 2020-06-29 NOTE — Telephone Encounter (Signed)
Pt needs to reschedule her colonoscopy on 5/26 with [facility]. [phone]

## 2020-07-05 ENCOUNTER — Other Ambulatory Visit (HOSPITAL_COMMUNITY)
Admission: RE | Admit: 2020-07-05 | Discharge: 2020-07-05 | Disposition: A | Discharge status: Home / Self Care | Payer: BC Managed Care – PPO | Source: Ambulatory Visit | Attending: Internal Medicine | Admitting: Internal Medicine

## 2020-07-05 DIAGNOSIS — M5441 Lumbago with sciatica, right side: Secondary | ICD-10-CM | POA: Diagnosis not present

## 2020-07-05 DIAGNOSIS — M545 Low back pain, unspecified: Secondary | ICD-10-CM | POA: Diagnosis not present

## 2020-07-05 DIAGNOSIS — M542 Cervicalgia: Secondary | ICD-10-CM | POA: Diagnosis not present

## 2020-07-05 DIAGNOSIS — M5442 Lumbago with sciatica, left side: Secondary | ICD-10-CM | POA: Diagnosis not present

## 2020-07-06 ENCOUNTER — Ambulatory Visit: Payer: BC Managed Care – PPO | Admitting: Physical Therapy

## 2020-07-07 ENCOUNTER — Ambulatory Visit (HOSPITAL_COMMUNITY)
Admission: RE | Admit: 2020-07-07 | Payer: BC Managed Care – PPO | Source: Home / Self Care | Admitting: Internal Medicine

## 2020-07-07 ENCOUNTER — Encounter (HOSPITAL_COMMUNITY): Admission: RE | Payer: Self-pay | Source: Home / Self Care

## 2020-07-07 SURGERY — COLONOSCOPY WITH PROPOFOL
Anesthesia: Monitor Anesthesia Care

## 2020-07-18 ENCOUNTER — Other Ambulatory Visit: Payer: Self-pay

## 2020-07-18 DIAGNOSIS — R202 Paresthesia of skin: Secondary | ICD-10-CM

## 2020-07-18 NOTE — Telephone Encounter (Signed)
LMOVM to call back to r/s 

## 2020-07-19 ENCOUNTER — Ambulatory Visit (INDEPENDENT_AMBULATORY_CARE_PROVIDER_SITE_OTHER): Payer: BC Managed Care – PPO | Admitting: Neurology

## 2020-07-19 ENCOUNTER — Other Ambulatory Visit: Payer: Self-pay

## 2020-07-19 DIAGNOSIS — R202 Paresthesia of skin: Secondary | ICD-10-CM

## 2020-07-19 DIAGNOSIS — M5417 Radiculopathy, lumbosacral region: Secondary | ICD-10-CM

## 2020-07-19 NOTE — Procedures (Signed)
[facility] [address]  [place], [place]  [place], [place] [address] Tel: [phone] Fax:  [phone] Test Date:  07/19/2020  Patient: Monica Cross DOB: 1955/10/25 Physician: Narda Amber, DO  Sex: Female Height: 5\' 4"  Ref Phys: Dorna Leitz, MD  ID#: [id number]   Technician:    Patient Complaints: This is a 65 year old female referred for evaluation of bilateral leg paresthesias.  NCV & EMG Findings: Extensive electrodiagnostic testing of the right lower extremity and additional studies of the left shows: 1. Bilateral sural and superficial peroneal sensory responses are within normal limits. 2. Bilateral peroneal and tibial motor responses are within normal limits 3. Bilateral tibial H reflex studies are within normal limits. 4. 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  These findings are not present in the left lower extremity.  Impression: 1. Chronic L4-L5 radiculopathy affecting the right lower extremity, mild. 2. There is no evidence of a large fiber sensorimotor polyneuropathy affecting the lower extremities.   ___________________________ Narda Amber, DO    Nerve Conduction Studies Anti Sensory Summary Table   Stim Site NR Peak (ms) Norm Peak (ms) P-T Amp (V) Norm P-T Amp  Left Sup Peroneal Anti Sensory (Ant Lat Mall)  32C  12 cm    1.7 <4.6 17.5 >3  Right Sup Peroneal Anti Sensory (Ant Lat Mall)  32C  12 cm    2.1 <4.6 14.5 >3  Left Sural Anti Sensory (Lat Mall)  32C  Calf    2.9 <4.6 24.0 >3  Right Sural Anti Sensory (Lat Mall)  32C  Calf    2.5 <4.6 25.8 >3   Motor Summary Table   Stim Site NR Onset (ms) Norm Onset (ms) O-P Amp (mV) Norm O-P Amp Site1 Site2 Delta-0 (ms) Dist (cm) Vel (m/s) Norm Vel (m/s)  Left Peroneal Motor (Ext Dig Brev)  32C  Ankle    4.6 <6.0 3.0 >2.5 B Fib Ankle 6.3 32.0 51 >40  B Fib    10.9  3.0  Poplt B Fib 1.3 8.0 62 >40  Poplt     12.2  2.9         Right Peroneal Motor (Ext Dig Brev)  32C  Ankle    3.7 <6.0 4.2 >2.5 B Fib Ankle 6.1 35.0 57 >40  B Fib    9.8  4.0  Poplt B Fib 1.4 8.0 57 >40  Poplt    11.2  3.8         Left Tibial Motor (Abd Hall Brev)  32C  Ankle    5.7 <6.0 13.8 >4 Knee Ankle 7.7 39.0 51 >40  Knee    13.4  11.8         Right Tibial Motor (Abd Hall Brev)  32C  Ankle    3.5 <6.0 12.8 >4 Knee Ankle 8.1 40.0 49 >40  Knee    11.6  11.4          H Reflex Studies   NR H-Lat (ms) Lat Norm (ms) L-R H-Lat (ms)  Left Tibial (Gastroc)  32C     31.97 <35 0.00  Right Tibial (Gastroc)  32C     31.97 <35 0.00   EMG   Side Muscle Ins Act Fibs Psw Fasc Number Recrt Dur Dur. Amp Amp. Poly Poly. Comment  Right AntTibialis Nml Nml Nml Nml 1- Rapid Few 1+ Few 1+ Few 1+ N/A  Right Flex Dig Long Nml Nml Nml  Nml Nml Nml Nml Nml Nml Nml Nml Nml N/A  Right AdductorLong Nml Nml Nml Nml Nml Nml Nml Nml Nml Nml Nml Nml N/A  Right Gastroc Nml Nml Nml Nml Nml Nml Nml Nml Nml Nml Nml Nml N/A  Right RectFemoris Nml Nml Nml Nml 1- Rapid Some 1+ Some 1+ Some 1+ N/A  Right GluteusMed Nml Nml Nml Nml 1- Rapid Some 1+ Some 1+ Some 1+ N/A  Left AdductorLong Nml Nml Nml Nml Nml Nml Nml Nml Nml Nml Nml Nml N/A  Left AntTibialis Nml Nml Nml Nml Nml Nml Nml Nml Nml Nml Nml Nml N/A  Left Gastroc Nml Nml Nml Nml Nml Nml Nml Nml Nml Nml Nml Nml N/A  Left Flex Dig Long Nml Nml Nml Nml Nml Nml Nml Nml Nml Nml Nml Nml N/A  Left RectFemoris Nml Nml Nml Nml Nml Nml Nml Nml Nml Nml Nml Nml N/A  Left GluteusMed Nml Nml Nml Nml Nml Nml Nml Nml Nml Nml Nml Nml N/A      Waveforms:

## 2020-07-19 NOTE — Telephone Encounter (Signed)
Received VM from pt. LMOVM to call back

## 2020-07-20 NOTE — Telephone Encounter (Signed)
Letter mailed

## 2020-07-21 ENCOUNTER — Telehealth: Payer: Self-pay | Admitting: Neurology

## 2020-07-21 NOTE — Telephone Encounter (Signed)
Patient would like a call back regarding results °

## 2020-07-21 NOTE — Telephone Encounter (Signed)
Pt requesting for the EMG results.Please advise

## 2020-07-22 NOTE — Telephone Encounter (Signed)
Notified pt Dr. Berenice Primas who order the EMG testing and needed to call office for the results. Pt voiced understnding

## 2020-07-22 NOTE — Telephone Encounter (Signed)
EMG ordered by Dr. Dorna Leitz, she needs to contact their office for results.

## 2020-07-28 DIAGNOSIS — M1711 Unilateral primary osteoarthritis, right knee: Secondary | ICD-10-CM | POA: Diagnosis not present

## 2020-07-28 DIAGNOSIS — M1712 Unilateral primary osteoarthritis, left knee: Secondary | ICD-10-CM | POA: Diagnosis not present

## 2020-07-28 DIAGNOSIS — M5441 Lumbago with sciatica, right side: Secondary | ICD-10-CM | POA: Diagnosis not present

## 2020-07-28 DIAGNOSIS — M5442 Lumbago with sciatica, left side: Secondary | ICD-10-CM | POA: Diagnosis not present

## 2020-08-01 ENCOUNTER — Telehealth: Payer: Self-pay | Admitting: Internal Medicine

## 2020-08-01 NOTE — Telephone Encounter (Signed)
LMOVM to call back 

## 2020-08-01 NOTE — Telephone Encounter (Signed)
Pt called wanting to schedule her procedure, She had received a letter. Please call [phone]

## 2020-08-01 NOTE — Telephone Encounter (Signed)
See prior note

## 2020-08-01 NOTE — Telephone Encounter (Signed)
Patient needs to be called about rescheduling the procedure

## 2020-08-02 DIAGNOSIS — M545 Low back pain, unspecified: Secondary | ICD-10-CM | POA: Diagnosis not present

## 2020-08-04 DIAGNOSIS — M542 Cervicalgia: Secondary | ICD-10-CM | POA: Diagnosis not present

## 2020-08-04 DIAGNOSIS — M5442 Lumbago with sciatica, left side: Secondary | ICD-10-CM | POA: Diagnosis not present

## 2020-08-04 DIAGNOSIS — M4722 Other spondylosis with radiculopathy, cervical region: Secondary | ICD-10-CM | POA: Diagnosis not present

## 2020-08-04 DIAGNOSIS — M5441 Lumbago with sciatica, right side: Secondary | ICD-10-CM | POA: Diagnosis not present

## 2020-08-08 DIAGNOSIS — M542 Cervicalgia: Secondary | ICD-10-CM | POA: Diagnosis not present

## 2020-08-12 DIAGNOSIS — G959 Disease of spinal cord, unspecified: Secondary | ICD-10-CM | POA: Diagnosis not present

## 2020-08-18 ENCOUNTER — Other Ambulatory Visit: Payer: Self-pay | Admitting: Orthopedic Surgery

## 2020-08-23 ENCOUNTER — Telehealth: Payer: Self-pay

## 2020-08-23 DIAGNOSIS — R195 Other fecal abnormalities: Secondary | ICD-10-CM

## 2020-08-23 NOTE — Telephone Encounter (Signed)
Noted. Called and informed pt we will call her to reschedule appt when Dr. Roseanne Kaufman future schedule is available.
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
## 2020-08-24 ENCOUNTER — Encounter: Payer: BC Managed Care – PPO | Admitting: Neurology

## 2020-09-02 NOTE — Pre-Procedure Instructions (Signed)
Surgical Instructions    Your procedure is scheduled on Wednesday, August 3rd, 2022.  Report to [facility] Main Entrance "A" at 06:30 A.M., then check in with the Admitting office.  Call this number if you have problems the morning of surgery:  [phone]   If you have any questions prior to your surgery date call [phone]: Open Monday-Friday 8am-4pm    Remember:  Do not eat after midnight the night before your surgery  You may drink clear liquids until 05:30 A.M. the morning of your surgery.   Clear liquids allowed are: Water, Non-Citrus Juices (without pulp), Carbonated Beverages, Clear Tea, Black Coffee Only, and Gatorade   Enhanced Recovery after Surgery for Orthopedics Enhanced Recovery after Surgery is a protocol used to improve the stress on your body and your recovery after surgery.  Patient Instructions  The day of surgery (if you do NOT have diabetes):  Drink ONE (1) Pre-Surgery Clear Ensure by 05:30 A.M. the morning of surgery   This drink was given to you during your hospital  pre-op appointment visit. Nothing else to drink after completing the  Pre-Surgery Clear Ensure.         If you have questions, please contact your surgeon's office.     Take these medicines the morning of surgery with A SIP OF WATER: atorvastatin (LIPITOR)  Choline Fenofibrate  Take these medications AS NEEDED:  HYDROcodone-acetaminophen  SUMAtriptan (IMITREX)   As of today, STOP taking any Aspirin (unless otherwise instructed by your surgeon) Aleve, Naproxen, Ibuprofen, Motrin, Advil, Goody's, BC's, all herbal medications, fish oil, and all vitamins.          Do not wear jewelry or makeup Do not wear lotions, powders, perfumes, or deodorant. Do not shave 48 hours prior to surgery. Do not bring valuables to the hospital. DO Not wear nail polish, gel polish, artificial nails, or any other type of covering on  natural nails including finger and toenails. If patients have  artificial nails, gel coating, etc. that need to be removed by a nail salon please have this removed prior to surgery or surgery may need to be canceled/delayed if the surgeon/ anesthesia feels like the patient is unable to be adequately monitored.             [facility] is not responsible for any belongings or valuables.  Do NOT Smoke (Tobacco/Vaping) or drink Alcohol 24 hours prior to your procedure If you use a CPAP at night, you may bring all equipment for your overnight stay.   Contacts, glasses, dentures or bridgework may not be worn into surgery, please bring cases for these belongings   For patients admitted to the hospital, discharge time will be determined by your treatment team.   Patients discharged the day of surgery will not be allowed to drive home, and someone needs to stay with them for 24 hours.  ONLY 1 SUPPORT PERSON MAY BE PRESENT WHILE YOU ARE IN SURGERY. IF YOU ARE TO BE ADMITTED ONCE YOU ARE IN YOUR ROOM YOU WILL BE ALLOWED TWO (2) VISITORS.  Minor children may have two parents present. Special consideration for safety and communication needs will be reviewed on a case by case basis.  Special instructions:    Oral Hygiene is also important to reduce your risk of infection.  Remember - BRUSH YOUR TEETH THE MORNING OF SURGERY WITH YOUR REGULAR TOOTHPASTE   [facility]- Preparing For Surgery  Before surgery, you can play an important role. Because skin is not  sterile, your skin needs to be as free of germs as possible. You can reduce the number of germs on your skin by washing with CHG (chlorahexidine gluconate) Soap before surgery.  CHG is an antiseptic cleaner which kills germs and bonds with the skin to continue killing germs even after washing.     Please do not use if you have an allergy to CHG or antibacterial soaps. If your skin becomes reddened/irritated stop using the CHG.  Do not shave (including legs and underarms) for at least 48 hours prior to first CHG  shower. It is OK to shave your face.  Please follow these instructions carefully.     Shower the NIGHT BEFORE SURGERY and the MORNING OF SURGERY with CHG Soap.   If you chose to wash your hair, wash your hair first as usual with your normal shampoo. After you shampoo, rinse your hair and body thoroughly to remove the shampoo.  Then [employer] and genitals (private parts) with your normal soap and rinse thoroughly to remove soap.  After that Use CHG Soap as you would any other liquid soap. You can apply CHG directly to the skin and wash gently with a scrungie or a clean washcloth.   Apply the CHG Soap to your body ONLY FROM THE NECK DOWN.  Do not use on open wounds or open sores. Avoid contact with your eyes, ears, mouth and genitals (private parts). Wash Face and genitals (private parts)  with your normal soap.   Wash thoroughly, paying special attention to the area where your surgery will be performed.  Thoroughly rinse your body with warm water from the neck down.  DO NOT shower/wash with your normal soap after using and rinsing off the CHG Soap.  Pat yourself dry with a CLEAN TOWEL.  Wear CLEAN PAJAMAS to bed the night before surgery  Place CLEAN SHEETS on your bed the night before your surgery  DO NOT SLEEP WITH PETS.   Day of Surgery:  Take a shower with CHG soap. Wear Clean/Comfortable clothing the morning of surgery Do not apply any deodorants/lotions.   Remember to brush your teeth WITH YOUR REGULAR TOOTHPASTE.   Please read over the following fact sheets that you were given.

## 2020-09-05 ENCOUNTER — Encounter (HOSPITAL_COMMUNITY): Payer: Self-pay

## 2020-09-05 ENCOUNTER — Other Ambulatory Visit: Payer: Self-pay | Admitting: Family

## 2020-09-05 ENCOUNTER — Other Ambulatory Visit: Payer: Self-pay

## 2020-09-05 ENCOUNTER — Encounter (HOSPITAL_COMMUNITY)
Admission: RE | Admit: 2020-09-05 | Discharge: 2020-09-05 | Disposition: A | Discharge status: Home / Self Care | Payer: BC Managed Care – PPO | Source: Ambulatory Visit | Attending: Neurosurgery | Admitting: Neurosurgery

## 2020-09-05 DIAGNOSIS — Z01818 Encounter for other preprocedural examination: Secondary | ICD-10-CM | POA: Diagnosis not present

## 2020-09-05 LAB — URINALYSIS, ROUTINE W REFLEX MICROSCOPIC
Bilirubin Urine: NEGATIVE
Glucose, UA: NEGATIVE mg/dL
Hgb urine dipstick: NEGATIVE
Ketones, ur: NEGATIVE mg/dL
Leukocytes,Ua: NEGATIVE
Nitrite: NEGATIVE
Protein, ur: NEGATIVE mg/dL
Specific Gravity, Urine: 1.015 (ref 1.005–1.030)
pH: 6.5 (ref 5.0–8.0)

## 2020-09-05 LAB — APTT: aPTT: 30 seconds (ref 24–36)

## 2020-09-05 LAB — COMPREHENSIVE METABOLIC PANEL
ALT: 23 U/L (ref 0–44)
AST: 22 U/L (ref 15–41)
Albumin: 3.8 g/dL (ref 3.5–5.0)
Alkaline Phosphatase: 43 U/L (ref 38–126)
Anion gap: 10 (ref 5–15)
BUN: 14 mg/dL (ref 8–23)
CO2: 25 mmol/L (ref 22–32)
Calcium: 10 mg/dL (ref 8.9–10.3)
Chloride: 104 mmol/L (ref 98–111)
Creatinine, Ser: 0.72 mg/dL (ref 0.44–1.00)
GFR, Estimated: >60 mL/min (ref >60)
Glucose, Bld: 100 mg/dL — ABNORMAL HIGH (ref 70–99)
Potassium: 3.8 mmol/L (ref 3.5–5.1)
Sodium: 139 mmol/L (ref 135–145)
Total Bilirubin: 0.6 mg/dL (ref 0.3–1.2)
Total Protein: 6.7 g/dL (ref 6.5–8.1)

## 2020-09-05 LAB — CBC WITH DIFFERENTIAL/PLATELET
Abs Immature Granulocytes: 0.03 10*3/uL (ref 0.00–0.07)
Basophils Absolute: 0.1 10*3/uL (ref 0.0–0.1)
Basophils Relative: 1 %
Eosinophils Absolute: 0 10*3/uL (ref 0.0–0.5)
Eosinophils Relative: 1 %
HCT: 40.1 % (ref 36.0–46.0)
Hemoglobin: 12.8 g/dL (ref 12.0–15.0)
Immature Granulocytes: 0 %
Lymphocytes Relative: 28 %
Lymphs Abs: 2.1 10*3/uL (ref 0.7–4.0)
MCH: 30.5 pg (ref 26.0–34.0)
MCHC: 31.9 g/dL (ref 30.0–36.0)
MCV: 95.5 fL (ref 80.0–100.0)
Monocytes Absolute: 0.5 10*3/uL (ref 0.1–1.0)
Monocytes Relative: 6 %
Neutro Abs: 4.9 10*3/uL (ref 1.7–7.7)
Neutrophils Relative %: 64 %
Platelets: 532 10*3/uL — ABNORMAL HIGH (ref 150–400)
RBC: 4.2 MIL/uL (ref 3.87–5.11)
RDW: 13.7 % (ref 11.5–15.5)
WBC: 7.6 10*3/uL (ref 4.0–10.5)
nRBC: 0 % (ref 0.0–0.2)

## 2020-09-05 LAB — SURGICAL PCR SCREEN
MRSA, PCR: NEGATIVE
Staphylococcus aureus: NEGATIVE

## 2020-09-05 LAB — TYPE AND SCREEN
ABO/RH(D): B POS
Antibody Screen: NEGATIVE

## 2020-09-05 LAB — PROTIME-INR
INR: 1 (ref 0.8–1.2)
Prothrombin Time: 13.4 seconds (ref 11.4–15.2)

## 2020-09-05 NOTE — Progress Notes (Signed)
PCP - Evelina Dun MD Cardiologist - pt denies  PPM/ICD - n/a  Chest x-ray - n/a EKG - 09/05/20 Stress Test - n/a ECHO - n/a Cardiac Cath - n/a  Sleep Study - n/a  Fasting Blood Sugar - n/a  Blood Thinner Instructions: n/a Aspirin Instructions: n/a  ERAS Protcol - yes PRE-SURGERY Ensure or G2- Ensure  COVID TEST- n/a, ambulatory surgery  **Initial VS BP: 176/98, heart rate 103; Repeat BP: 160/94 and HR: 97  Anesthesia review: no  Patient denies shortness of breath, fever, cough and chest pain at PAT appointment   All instructions explained to the patient, with a verbal understanding of the material. Patient agrees to go over the instructions while at home for a better understanding. Patient also instructed to self quarantine after being tested for COVID-19. The opportunity to ask questions was provided.

## 2020-09-09 DIAGNOSIS — G959 Disease of spinal cord, unspecified: Secondary | ICD-10-CM | POA: Diagnosis not present

## 2020-09-09 NOTE — Telephone Encounter (Signed)
Pt returned call. She has been scheduled for 9/26 at 10:45am. Aware will mail new instructions. Already has prep at home.

## 2020-09-09 NOTE — Addendum Note (Signed)
Addended by: Cheron Every on: 09/09/2020 10:03 AM   Modules accepted: Orders

## 2020-09-09 NOTE — Telephone Encounter (Signed)
Called pt, LMOVM to call back to reschedule

## 2020-09-12 ENCOUNTER — Institutional Professional Consult (permissible substitution): Payer: BC Managed Care – PPO | Admitting: Neurology
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
## 2020-09-14 ENCOUNTER — Ambulatory Visit (HOSPITAL_COMMUNITY): Payer: BC Managed Care – PPO | Admitting: Anesthesiology

## 2020-09-14 ENCOUNTER — Ambulatory Visit (HOSPITAL_COMMUNITY)
Admission: RE | Disposition: A | Discharge status: Home / Self Care | Payer: Self-pay | Source: Ambulatory Visit | Attending: Orthopedic Surgery

## 2020-09-14 ENCOUNTER — Ambulatory Visit (HOSPITAL_COMMUNITY): Payer: BC Managed Care – PPO

## 2020-09-14 ENCOUNTER — Other Ambulatory Visit: Payer: Self-pay

## 2020-09-14 ENCOUNTER — Encounter (HOSPITAL_COMMUNITY): Payer: Self-pay | Admitting: Orthopedic Surgery

## 2020-09-14 ENCOUNTER — Ambulatory Visit (HOSPITAL_COMMUNITY)
Admission: RE | Admit: 2020-09-14 | Discharge: 2020-09-14 | Disposition: A | Discharge status: Home / Self Care | Payer: BC Managed Care – PPO | Source: Ambulatory Visit | Attending: Orthopedic Surgery | Admitting: Orthopedic Surgery

## 2020-09-14 DIAGNOSIS — I10 Essential (primary) hypertension: Secondary | ICD-10-CM | POA: Diagnosis not present

## 2020-09-14 DIAGNOSIS — G4700 Insomnia, unspecified: Secondary | ICD-10-CM | POA: Diagnosis not present

## 2020-09-14 DIAGNOSIS — Z9889 Other specified postprocedural states: Secondary | ICD-10-CM | POA: Diagnosis not present

## 2020-09-14 DIAGNOSIS — G9529 Other cord compression: Secondary | ICD-10-CM | POA: Insufficient documentation

## 2020-09-14 DIAGNOSIS — M5001 Cervical disc disorder with myelopathy,  high cervical region: Secondary | ICD-10-CM | POA: Diagnosis not present

## 2020-09-14 DIAGNOSIS — M50021 Cervical disc disorder at C4-C5 level with myelopathy: Secondary | ICD-10-CM | POA: Diagnosis not present

## 2020-09-14 DIAGNOSIS — Z419 Encounter for procedure for purposes other than remedying health state, unspecified: Secondary | ICD-10-CM

## 2020-09-14 DIAGNOSIS — Z79899 Other long term (current) drug therapy: Secondary | ICD-10-CM | POA: Insufficient documentation

## 2020-09-14 DIAGNOSIS — G9520 Unspecified cord compression: Secondary | ICD-10-CM | POA: Diagnosis not present

## 2020-09-14 DIAGNOSIS — G959 Disease of spinal cord, unspecified: Secondary | ICD-10-CM | POA: Diagnosis not present

## 2020-09-14 HISTORY — PX: ANTERIOR CERVICAL DECOMP/DISCECTOMY FUSION: SHX1161

## 2020-09-14 LAB — ABO/RH: ABO/RH(D): B POS

## 2020-09-14 SURGERY — ANTERIOR CERVICAL DECOMPRESSION/DISCECTOMY FUSION 2 LEVELS
Anesthesia: General

## 2020-09-14 MED ORDER — FENTANYL CITRATE (PF) 250 MCG/5ML IJ SOLN
INTRAMUSCULAR | Status: DC | PRN
  Administered 2020-09-14 (×5): 50 ug via INTRAVENOUS

## 2020-09-14 MED ORDER — FENTANYL CITRATE (PF) 250 MCG/5ML IJ SOLN
INTRAMUSCULAR | Status: AC
  Filled 2020-09-14: qty 5

## 2020-09-14 MED ORDER — LIDOCAINE 2% (20 MG/ML) 5 ML SYRINGE
INTRAMUSCULAR | Status: DC | PRN
  Administered 2020-09-14: 60 mg via INTRAVENOUS

## 2020-09-14 MED ORDER — THROMBIN 20000 UNITS EX SOLR
CUTANEOUS | Status: DC | PRN
  Administered 2020-09-14: 20 mL via TOPICAL

## 2020-09-14 MED ORDER — BUPIVACAINE-EPINEPHRINE (PF) 0.25% -1:200000 IJ SOLN
INTRAMUSCULAR | Status: AC
  Filled 2020-09-14: qty 30

## 2020-09-14 MED ORDER — ROCURONIUM BROMIDE 10 MG/ML (PF) SYRINGE
PREFILLED_SYRINGE | INTRAVENOUS | Status: DC | PRN
Start: 2020-09-14 — End: 2020-09-14
  Administered 2020-09-14: 80 mg via INTRAVENOUS

## 2020-09-14 MED ORDER — MIDAZOLAM HCL 2 MG/2ML IJ SOLN
INTRAMUSCULAR | Status: AC
  Filled 2020-09-14: qty 2

## 2020-09-14 MED ORDER — PHENYLEPHRINE 40 MCG/ML (10ML) SYRINGE FOR IV PUSH (FOR BLOOD PRESSURE SUPPORT)
PREFILLED_SYRINGE | INTRAVENOUS | Status: AC
  Filled 2020-09-14: qty 20

## 2020-09-14 MED ORDER — CEFAZOLIN SODIUM-DEXTROSE 2-4 GM/100ML-% IV SOLN
2.0000 g | INTRAVENOUS | Status: AC
  Administered 2020-09-14: 2 g via INTRAVENOUS
  Filled 2020-09-14: qty 100

## 2020-09-14 MED ORDER — HEMOSTATIC AGENTS (NO CHARGE) OPTIME
TOPICAL | Status: DC | PRN
  Administered 2020-09-14: 1 via TOPICAL

## 2020-09-14 MED ORDER — HYDROCODONE-ACETAMINOPHEN 5-325 MG PO TABS: 1.0000 | ORAL_TABLET | Freq: Four times a day (QID) | ORAL | 0 refills | Status: DC | PRN

## 2020-09-14 MED ORDER — DEXAMETHASONE SODIUM PHOSPHATE 10 MG/ML IJ SOLN
INTRAMUSCULAR | Status: AC
  Filled 2020-09-14: qty 1

## 2020-09-14 MED ORDER — HYDROMORPHONE HCL 1 MG/ML IJ SOLN
INTRAMUSCULAR | Status: AC
  Filled 2020-09-14: qty 1

## 2020-09-14 MED ORDER — ONDANSETRON HCL 4 MG/2ML IJ SOLN
INTRAMUSCULAR | Status: AC
  Filled 2020-09-14: qty 2

## 2020-09-14 MED ORDER — LACTATED RINGERS IV SOLN: INTRAVENOUS | Status: DC

## 2020-09-14 MED ORDER — PROMETHAZINE HCL 25 MG/ML IJ SOLN: 6.2500 mg | INTRAMUSCULAR | Status: DC | PRN

## 2020-09-14 MED ORDER — BUPIVACAINE-EPINEPHRINE 0.25% -1:200000 IJ SOLN: INTRAMUSCULAR | Status: DC | PRN

## 2020-09-14 MED ORDER — MIDAZOLAM HCL 2 MG/2ML IJ SOLN
INTRAMUSCULAR | Status: DC | PRN
Start: 2020-09-14 — End: 2020-09-14
  Administered 2020-09-14: 2 mg via INTRAVENOUS

## 2020-09-14 MED ORDER — OXYCODONE HCL 5 MG/5ML PO SOLN: 5.0000 mg | Freq: Once | ORAL | Status: DC | PRN

## 2020-09-14 MED ORDER — ONDANSETRON HCL 4 MG/2ML IJ SOLN
INTRAMUSCULAR | Status: DC | PRN
  Administered 2020-09-14: 4 mg via INTRAVENOUS

## 2020-09-14 MED ORDER — PROPOFOL 10 MG/ML IV BOLUS
INTRAVENOUS | Status: DC | PRN
  Administered 2020-09-14: 150 mg via INTRAVENOUS
  Administered 2020-09-14: 50 mg via INTRAVENOUS

## 2020-09-14 MED ORDER — PHENYLEPHRINE HCL-NACL 20-0.9 MG/250ML-% IV SOLN
INTRAVENOUS | Status: DC | PRN
  Administered 2020-09-14: 50 ug/min via INTRAVENOUS

## 2020-09-14 MED ORDER — PROPOFOL 10 MG/ML IV BOLUS
INTRAVENOUS | Status: AC
  Filled 2020-09-14: qty 20

## 2020-09-14 MED ORDER — SUGAMMADEX SODIUM 200 MG/2ML IV SOLN
INTRAVENOUS | Status: DC | PRN
Start: 2020-09-14 — End: 2020-09-14
  Administered 2020-09-14: 200 mg via INTRAVENOUS

## 2020-09-14 MED ORDER — HYDROMORPHONE HCL 1 MG/ML IJ SOLN
0.2500 mg | INTRAMUSCULAR | Status: DC | PRN
  Administered 2020-09-14 (×2): 0.5 mg via INTRAVENOUS

## 2020-09-14 MED ORDER — ACETAMINOPHEN 500 MG PO TABS
1000.0000 mg | ORAL_TABLET | Freq: Once | ORAL | Status: AC
  Administered 2020-09-14: 1000 mg via ORAL
  Filled 2020-09-14: qty 2

## 2020-09-14 MED ORDER — ALBUMIN HUMAN 5 % IV SOLN: INTRAVENOUS | Status: DC | PRN

## 2020-09-14 MED ORDER — ORAL CARE MOUTH RINSE: 15.0000 mL | Freq: Once | OROMUCOSAL | Status: AC

## 2020-09-14 MED ORDER — THROMBIN (RECOMBINANT) 20000 UNITS EX SOLR
CUTANEOUS | Status: AC
  Filled 2020-09-14: qty 20000

## 2020-09-14 MED ORDER — ROCURONIUM BROMIDE 10 MG/ML (PF) SYRINGE
PREFILLED_SYRINGE | INTRAVENOUS | Status: AC
  Filled 2020-09-14: qty 10

## 2020-09-14 MED ORDER — PHENYLEPHRINE HCL (PRESSORS) 10 MG/ML IV SOLN
INTRAVENOUS | Status: DC | PRN
  Administered 2020-09-14 (×10): 80 ug via INTRAVENOUS

## 2020-09-14 MED ORDER — 0.9 % SODIUM CHLORIDE (POUR BTL) OPTIME
TOPICAL | Status: DC | PRN
  Administered 2020-09-14: 1000 mL

## 2020-09-14 MED ORDER — LIDOCAINE 2% (20 MG/ML) 5 ML SYRINGE
INTRAMUSCULAR | Status: AC
  Filled 2020-09-14: qty 5

## 2020-09-14 MED ORDER — METHOCARBAMOL 500 MG PO TABS
500.0000 mg | ORAL_TABLET | Freq: Four times a day (QID) | ORAL | 0 refills | Status: DC | PRN
Start: 2020-09-14 — End: 2020-11-29

## 2020-09-14 MED ORDER — OXYCODONE HCL 5 MG PO TABS: 5.0000 mg | ORAL_TABLET | Freq: Once | ORAL | Status: DC | PRN

## 2020-09-14 MED ORDER — CHLORHEXIDINE GLUCONATE 0.12 % MT SOLN
15.0000 mL | Freq: Once | OROMUCOSAL | Status: AC
  Administered 2020-09-14: 15 mL via OROMUCOSAL
  Filled 2020-09-14: qty 15

## 2020-09-14 MED ORDER — THROMBIN 20000 UNITS EX SOLR: CUTANEOUS | Status: DC | PRN

## 2020-09-14 MED ORDER — POVIDONE-IODINE 7.5 % EX SOLN
Freq: Once | CUTANEOUS | Status: DC
  Filled 2020-09-14: qty 118

## 2020-09-14 SURGICAL SUPPLY — 78 items
AGENT HMST KT MTR STRL THRMB (HEMOSTASIS) ×1
APL SKNCLS STERI-STRIP NONHPOA (GAUZE/BANDAGES/DRESSINGS) ×1
BAG COUNTER SPONGE SURGICOUNT (BAG) ×2 IMPLANT
BAG SPNG CNTER NS LX DISP (BAG) ×1
BENZOIN TINCTURE PRP APPL 2/3 (GAUZE/BANDAGES/DRESSINGS) ×2 IMPLANT
BIT DRILL NEURO 2X3.1 SFT TUCH (MISCELLANEOUS) ×1 IMPLANT
BIT DRILL SRG 14X2.2XFLT CHK (BIT) ×1 IMPLANT
BIT DRL SRG 14X2.2XFLT CHK (BIT) ×1
BLADE CLIPPER SURG (BLADE) ×2 IMPLANT
BLADE SURG 15 STRL LF DISP TIS (BLADE) ×1 IMPLANT
BLADE SURG 15 STRL SS (BLADE) ×2
BONE VIVIGEN FORMABLE 1.3CC (Bone Implant) ×4 IMPLANT
BUR NEURO DRILL SOFT 3.0X3.8M (BURR) ×2 IMPLANT
CARTRIDGE OIL MAESTRO DRILL (MISCELLANEOUS) ×1 IMPLANT
CLSR STERI-STRIP ANTIMIC 1/2X4 (GAUZE/BANDAGES/DRESSINGS) ×2 IMPLANT
COLLAR CERV LO CONTOUR FIRM DE (SOFTGOODS) ×2 IMPLANT
CORD BIPOLAR FORCEPS 12FT (ELECTRODE) ×2 IMPLANT
COVER SURGICAL LIGHT HANDLE (MISCELLANEOUS) ×2 IMPLANT
DECANTER SPIKE VIAL GLASS SM (MISCELLANEOUS) ×2 IMPLANT
DIFFUSER DRILL AIR PNEUMATIC (MISCELLANEOUS) ×2 IMPLANT
DRAPE C-ARM 42X72 X-RAY (DRAPES) ×2 IMPLANT
DRAPE POUCH INSTRU U-SHP 10X18 (DRAPES) ×2 IMPLANT
DRAPE SURG 17X23 STRL (DRAPES) ×8 IMPLANT
DRILL BIT SKYLINE 14MM (BIT) ×2
DRILL NEURO 2X3.1 SOFT TOUCH (MISCELLANEOUS) ×2
DURAPREP 26ML APPLICATOR (WOUND CARE) ×2 IMPLANT
ELECT COATED BLADE 2.86 ST (ELECTRODE) ×2 IMPLANT
ELECT REM PT RETURN 9FT ADLT (ELECTROSURGICAL) ×2
ELECTRODE REM PT RTRN 9FT ADLT (ELECTROSURGICAL) ×1 IMPLANT
GAUZE 4X4 16PLY ~~LOC~~+RFID DBL (SPONGE) ×2 IMPLANT
GAUZE SPONGE 4X4 12PLY STRL (GAUZE/BANDAGES/DRESSINGS) ×2 IMPLANT
GLOVE SRG 8 PF TXTR STRL LF DI (GLOVE) ×1 IMPLANT
GLOVE SURG ENC MOIS LTX SZ7 (GLOVE) ×2 IMPLANT
GLOVE SURG ENC MOIS LTX SZ8 (GLOVE) ×2 IMPLANT
GLOVE SURG UNDER POLY LF SZ7 (GLOVE) ×4 IMPLANT
GLOVE SURG UNDER POLY LF SZ8 (GLOVE) ×2
GOWN STRL REUS W/ TWL LRG LVL3 (GOWN DISPOSABLE) ×1 IMPLANT
GOWN STRL REUS W/ TWL XL LVL3 (GOWN DISPOSABLE) ×1 IMPLANT
GOWN STRL REUS W/TWL LRG LVL3 (GOWN DISPOSABLE) ×2
GOWN STRL REUS W/TWL XL LVL3 (GOWN DISPOSABLE) ×2
INTERLOCK LRDTC CRVCL VBR 6MM (Bone Implant) ×1 IMPLANT
INTERLOCK LRDTC CRVCL VBR 7MM (Bone Implant) ×1 IMPLANT
IV CATH 14GX2 1/4 (CATHETERS) ×2 IMPLANT
KIT BASIN OR (CUSTOM PROCEDURE TRAY) ×2 IMPLANT
KIT TURNOVER KIT B (KITS) ×2 IMPLANT
LORDOTIC CERVICAL VBR 6MM SM (Bone Implant) ×2 IMPLANT
LORDOTIC CERVICAL VBR 7MM SM (Bone Implant) ×2 IMPLANT
MANIFOLD NEPTUNE II (INSTRUMENTS) ×2 IMPLANT
NEEDLE PRECISIONGLIDE 27X1.5 (NEEDLE) ×2 IMPLANT
NEEDLE SPNL 20GX3.5 QUINCKE YW (NEEDLE) ×2 IMPLANT
NS IRRIG 1000ML POUR BTL (IV SOLUTION) ×2 IMPLANT
OIL CARTRIDGE MAESTRO DRILL (MISCELLANEOUS) ×2
PACK ORTHO CERVICAL (CUSTOM PROCEDURE TRAY) ×2 IMPLANT
PAD ARMBOARD 7.5X6 YLW CONV (MISCELLANEOUS) ×4 IMPLANT
PATTIES SURGICAL .5 X.5 (GAUZE/BANDAGES/DRESSINGS) ×2 IMPLANT
PATTIES SURGICAL .5 X1 (DISPOSABLE) ×2 IMPLANT
PIN DISTRACTION 14 (PIN) ×4 IMPLANT
PLATE TWO LEVEL SKYLINE 30MM (Plate) ×2 IMPLANT
POSITIONER HEAD DONUT 9IN (MISCELLANEOUS) ×2 IMPLANT
SCREW SKYLINE VAR OS 14MM (Screw) ×12 IMPLANT
SPONGE INTESTINAL PEANUT (DISPOSABLE) ×6 IMPLANT
SPONGE SURGIFOAM ABS GEL 100 (HEMOSTASIS) ×2 IMPLANT
STRIP CLOSURE SKIN 1/2X4 (GAUZE/BANDAGES/DRESSINGS) ×2 IMPLANT
SURGIFLO W/THROMBIN 8M KIT (HEMOSTASIS) ×2 IMPLANT
SUT MNCRL AB 3-0 PS2 27 (SUTURE) ×2 IMPLANT
SUT MNCRL AB 4-0 PS2 18 (SUTURE) ×2 IMPLANT
SUT MON AB 2-0 CT1 27 (SUTURE) ×2 IMPLANT
SUT MON AB 2-0 CT1 36 (SUTURE) ×2 IMPLANT
SUT VIC AB 2-0 CT2 18 VCP726D (SUTURE) ×2 IMPLANT
SYR BULB IRRIG 60ML STRL (SYRINGE) ×2 IMPLANT
SYR CONTROL 10ML LL (SYRINGE) ×6 IMPLANT
TAPE CLOTH 4X10 WHT NS (GAUZE/BANDAGES/DRESSINGS) ×2 IMPLANT
TAPE CLOTH SURG 4X10 WHT LF (GAUZE/BANDAGES/DRESSINGS) ×2 IMPLANT
TAPE UMBILICAL COTTON 1/8X30 (MISCELLANEOUS) ×2 IMPLANT
TOWEL GREEN STERILE (TOWEL DISPOSABLE) ×2 IMPLANT
TOWEL GREEN STERILE FF (TOWEL DISPOSABLE) ×2 IMPLANT
WATER STERILE IRR 1000ML POUR (IV SOLUTION) ×2 IMPLANT
YANKAUER SUCT BULB TIP NO VENT (SUCTIONS) ×2 IMPLANT

## 2020-09-14 NOTE — Op Note (Signed)
PATIENT NAME: Monica Cross   MEDICAL RECORD NO.:   [id number]    DATE OF BIRTH: Nov 30, 1955   DATE OF PROCEDURE: 09/14/2020                               OPERATIVE REPORT     PREOPERATIVE DIAGNOSES: 1.  Progressive cervical myelopathy 2. Spinal cord compression spanning C3-C5.   POSTOPERATIVE DIAGNOSES: 1.  Progressive cervical myelopathy 2. Spinal cord compression spanning C3-C5.   PROCEDURE: 1. Anterior cervical decompression and fusion C3/4, C4/5 2. Placement of anterior instrumentation, C3-C5 3. Insertion of interbody device x 2 (Titan intervertebral spacers). 4. Intraoperative use of fluoroscopy. 5. Use of morselized allograft - ViviGen.   SURGEON:  Phylliss Bob, MD   ASSISTANT:  Pricilla Holm, PA-C.   ANESTHESIA:  General endotracheal anesthesia.   COMPLICATIONS:  None.   DISPOSITION:  Stable.   ESTIMATED BLOOD LOSS:  Minimal.   INDICATIONS FOR SURGERY:  Briefly, Monica Cross  is a pleasant 65 y.o. year-old female, who did present to me with progressive deterioration in balance.  The patient's MRI did reveal the findings noted above.  Given the patient's ongoing myelopathic symptoms and MRI findings, we did discuss proceeding with the procedure noted above.  The patient was fully aware of the risks and limitations of surgery as outlined in my preoperative note.   OPERATIVE DETAILS:  On 09/14/2020, the patient was brought to surgery and general endotracheal anesthesia was administered.  The patient was placed supine on the hospital bed. The neck was gently extended.  All bony prominences were meticulously padded.  The neck was prepped and draped in the usual sterile fashion.  At this point, I did make a left-sided transverse incision.  The platysma was incised.  A Smith-Robinson approach was used and the anterior spine was identified. A self-retaining retractor was placed.  I then subperiosteally exposed the vertebral bodies from C3-C5.  Caspar pins were then placed into the  C4 and C5 vertebral bodies and distraction was applied.  A thorough and complete C4-5 intervertebral diskectomy was performed.  The posterior longitudinal ligament was identified and entered using a nerve hook.  I then used #1 followed by #2 Kerrison to perform a thorough and complete intervertebral diskectomy.  The spinal canal was thoroughly decompressed, as was the right and left neuroforamen.  The endplates were then prepared and the appropriate-sized intervertebral spacer was then packed with ViviGen and tamped into position in the usual fashion.  The lower Caspar pin was then removed and placed into the C3 vertebral body and once again, distraction was applied across the C3-4 intervertebral space.  I then again performed a thorough and complete diskectomy, thoroughly decompressing the spinal canal and bilateral neuroforamena.  After preparing the endplates, the appropriate-sized intervertebral spacer was packed with ViviGen and tamped into position.  The Caspar pins then were removed and bone wax was placed in their place.  The appropriate-sized anterior cervical plate was placed over the anterior spine.  14 mm variable angle screws were placed, 2 in each vertebral body from C3-C5 for a total of 6 vertebral body screws.  The screws were then locked to the plate using the Cam locking mechanism.  I was very pleased with the final fluoroscopic images.  The wound was then irrigated.  The wound was then explored for any undue bleeding and there was no bleeding noted. The wound was then closed in layers using 2-0 Vicryl,  followed by 3-0 Monocryl.  Benzoin and Steri-Strips were applied, followed by sterile dressing.  All instrument counts were correct at the termination of the procedure.   Of note, Pricilla Holm, PA-C, was my assistant throughout surgery, and did aid in retraction, suctioning, placement of the hardware, and closure from start to finish.         Phylliss Bob, MD

## 2020-09-14 NOTE — Transfer of Care (Signed)
Immediate Anesthesia Transfer of Care Note  Patient: [name]  Procedure(s) Performed: ANTERIOR CERVICAL DECOMPRESSION FUSION CERVICAL 3- CERVICAL 4, CERVICAL 4- CERVICAL 5 WITH INSTRUMENTATION AND ALLOGRAFT  Patient Location: PACU  Anesthesia Type:General  Level of Consciousness: awake, alert , patient cooperative and responds to stimulation  Airway & Oxygen Therapy: Patient Spontanous Breathing and Patient connected to face mask oxygen  Post-op Assessment: Report given to RN, Post -op Vital signs reviewed and stable and Patient moving all extremities X 4  Post vital signs: Reviewed and stable  Last Vitals:  Vitals Value Taken Time  BP    Temp    Pulse    Resp    SpO2      Last Pain:  Vitals:   09/14/20 0647  TempSrc:   PainSc: 6       Patients Stated Pain Goal: 3 ([phone] [id number])  Complications: No notable events documented.

## 2020-09-14 NOTE — Anesthesia Procedure Notes (Signed)
Procedure Name: Intubation Date/Time: 09/14/2020 8:38 AM Performed by: Michele Rockers, CRNA Pre-anesthesia Checklist: Patient identified, Patient being monitored, Timeout performed, Emergency Drugs available and Suction available Patient Re-evaluated:Patient Re-evaluated prior to induction Oxygen Delivery Method: Circle System Utilized Preoxygenation: Pre-oxygenation with 100% oxygen Induction Type: IV induction Ventilation: Mask ventilation without difficulty Laryngoscope Size: Miller and 2 Grade View: Grade I Tube type: Oral Tube size: 7.0 mm Number of attempts: 1 Airway Equipment and Method: Stylet Placement Confirmation: ETT inserted through vocal cords under direct vision, positive ETCO2 and breath sounds checked- equal and bilateral Secured at: 20 cm Tube secured with: Tape Dental Injury: Teeth and Oropharynx as per pre-operative assessment

## 2020-09-14 NOTE — H&P (Signed)
PREOPERATIVE H&P  Chief Complaint: Balance deterioration  HPI: Monica Cross is a 65 y.o. female who presents with ongoing symptoms of cervical myelopathy, including deterioration in balance  MRI reveals severe cord compression and C3/4 and C4/5  Patient has failed multiple forms of conservative care and continues to have pain (see office notes for additional details regarding the patient's full course of treatment)  Past Medical History:  Diagnosis Date   Anxiety    Arthritis    Left and right hip   Carpal tunnel syndrome    Bilat   Depression    Headache    Hyperlipidemia    Hypertension    Insomnia    patient denies   Osteoporosis    patient denies   Vision abnormalities    Past Surgical History:  Procedure Laterality Date   CARPAL TUNNEL RELEASE     CESAREAN SECTION     COSMETIC SURGERY     Social History   Socioeconomic History   Marital status: Single    Spouse name: Not on file   Number of children: Not on file   Years of education: Not on file   Highest education level: Not on file  Occupational History   Not on file  Tobacco Use   Smoking status: Never   Smokeless tobacco: Never  Vaping Use   Vaping Use: Never used  Substance and Sexual Activity   Alcohol use: No   Drug use: No   Sexual activity: Not on file  Other Topics Concern   Not on file  Social History Narrative   Not on file   Social Determinants of Health   Financial Resource Strain: Not on file  Food Insecurity: Not on file  Transportation Needs: Not on file  Physical Activity: Not on file  Stress: Not on file  Social Connections: Not on file   Family History  Problem Relation Age of Onset   Heart disease Mother    Hypertension Mother    Other Father        head injury   Stroke Sister    Osteoporosis Sister    Lung cancer Brother    Liver cancer Brother    Breast cancer Sister    Osteoporosis Sister    COPD Sister    Osteoporosis Sister    Healthy Sister     Osteoporosis Sister    Other Sister        benign brain tumor   Osteoporosis Sister    Colon cancer Neg Hx    No Known Allergies Prior to Admission medications   Medication Sig Start Date End Date Taking? Authorizing Provider  atorvastatin (LIPITOR) 20 MG tablet Take 1 tablet (20 mg total) by mouth daily. 11/11/18  Yes Terald Sleeper, PA-C  Choline Fenofibrate 135 MG capsule Take 135 mg by mouth daily.   Yes [provider]  HYDROcodone-acetaminophen (NORCO/VICODIN) 5-325 MG tablet Take 1 tablet by mouth every 8 (eight) hours as needed for moderate pain. 05/19/20  Yes [provider]  SUMAtriptan (IMITREX) 50 MG tablet TAKE ONE TABLET BY MOUTH every TWO hours as needed FOR migraine - MAY REPEAT in [place] if headache persists OR recurs Patient taking differently: Take 50 mg by mouth every 2 (two) hours as needed for migraine. 09/30/18  Yes Terald Sleeper, PA-C  telmisartan-hydrochlorothiazide (MICARDIS HCT) 80-25 MG tablet Take 1 tablet by mouth daily. 09/05/20  Yes Hawks, Christy A, FNP  VITAMIN D PO Take 1 capsule  by mouth daily.   Yes [provider]  citalopram (CELEXA) 40 MG tablet Take 1 tablet (40 mg total) by mouth daily. Patient not taking: No sig reported 08/20/19   Evelina Dun A, FNP  fenofibrate (TRICOR) 145 MG tablet Take 1 tablet (145 mg total) by mouth daily. Patient not taking: No sig reported 11/11/18   Terald Sleeper, PA-C  polyethylene glycol-electrolytes (TRILYTE) 420 g solution Take 4,000 mLs by mouth as directed. 05/23/20   Rourk, Cristopher Estimable, MD  tiZANidine (ZANAFLEX) 4 MG tablet Take 1 tablet (4 mg total) by mouth every 8 (eight) hours as needed for muscle spasms. Patient not taking: No sig reported 06/03/20   Gwenlyn Perking, FNP     All other systems have been reviewed and were otherwise negative with the exception of those mentioned in the HPI and as above.  Physical Exam: Vitals:   09/14/20 0637  BP: (!) 159/86  Pulse: 98  Resp: 18   Temp: 98.1 F (36.7 C)  SpO2: 99%    Body mass index is 31.41 kg/m.  General: Alert, no acute distress Cardiovascular: No pedal edema Respiratory: No cyanosis, no use of accessory musculature Skin: No lesions in the area of chief complaint Neurologic: Sensation intact distally Psychiatric: Patient is competent for consent with normal mood and affect Lymphatic: No axillary or cervical lymphadenopathy   Assessment/Plan: PROGRESSIVE CERVICAL MYELOPATHY Plan for Procedure(s): ANTERIOR CERVICAL DECOMPRESSION FUSION CERVICAL 3- CERVICAL 4, CERVICAL 4- CERVICAL 5 WITH INSTRUMENTATION AND ALLOGRAFT   Norva Karvonen, MD 09/14/2020 7:44 AM

## 2020-09-14 NOTE — Anesthesia Postprocedure Evaluation (Signed)
Anesthesia Post Note  Patient: Monica Cross  Procedure(s) Performed: ANTERIOR CERVICAL DECOMPRESSION FUSION CERVICAL 3- CERVICAL 4, CERVICAL 4- CERVICAL 5 WITH INSTRUMENTATION AND ALLOGRAFT     Patient location during evaluation: PACU Anesthesia Type: General Level of consciousness: awake and alert, oriented and patient cooperative Pain management: pain level controlled Vital Signs Assessment: post-procedure vital signs reviewed and stable Respiratory status: spontaneous breathing, nonlabored ventilation and respiratory function stable Cardiovascular status: blood pressure returned to baseline and stable Postop Assessment: no apparent nausea or vomiting Anesthetic complications: no   No notable events documented.  Last Vitals:  Vitals:   09/14/20 1240 09/14/20 1255  BP: 134/79 128/82  Pulse: 91 91  Resp: 17 18  Temp:    SpO2: 95% 97%    Last Pain:  Vitals:   09/14/20 0647  TempSrc:   PainSc: [place]

## 2020-09-15 ENCOUNTER — Encounter (HOSPITAL_COMMUNITY): Payer: Self-pay | Admitting: Orthopedic Surgery

## 2020-09-15 MED FILL — Thrombin (Recombinant) For Soln 20000 Unit: CUTANEOUS | Qty: 1 | Status: AC

## 2020-09-16 ENCOUNTER — Other Ambulatory Visit: Payer: Self-pay | Admitting: *Deleted

## 2020-09-16 MED ORDER — CHOLINE FENOFIBRATE 135 MG PO CPDR: 135.0000 mg | DELAYED_RELEASE_CAPSULE | Freq: Every day | ORAL | 1 refills | Status: DC

## 2020-09-16 MED ORDER — ATORVASTATIN CALCIUM 20 MG PO TABS: 20.0000 mg | ORAL_TABLET | Freq: Every day | ORAL | 0 refills | Status: DC

## 2020-09-16 MED ORDER — TELMISARTAN-HCTZ 80-25 MG PO TABS: 1.0000 | ORAL_TABLET | Freq: Every day | ORAL | 0 refills | Status: DC

## 2020-09-21 ENCOUNTER — Other Ambulatory Visit: Payer: Self-pay | Admitting: *Deleted

## 2020-09-26 ENCOUNTER — Encounter: Payer: BC Managed Care – PPO | Admitting: Family

## 2020-09-28 DIAGNOSIS — Z9889 Other specified postprocedural states: Secondary | ICD-10-CM | POA: Diagnosis not present

## 2020-09-28 DIAGNOSIS — G9520 Unspecified cord compression: Secondary | ICD-10-CM | POA: Diagnosis not present

## 2020-10-05 DIAGNOSIS — Z981 Arthrodesis status: Secondary | ICD-10-CM | POA: Diagnosis not present

## 2020-10-28 DIAGNOSIS — Z9889 Other specified postprocedural states: Secondary | ICD-10-CM | POA: Diagnosis not present

## 2020-11-04 ENCOUNTER — Other Ambulatory Visit: Payer: Self-pay

## 2020-11-04 ENCOUNTER — Other Ambulatory Visit (HOSPITAL_COMMUNITY)
Admission: RE | Admit: 2020-11-04 | Discharge: 2020-11-04 | Disposition: A | Discharge status: Home / Self Care | Payer: BC Managed Care – PPO | Source: Ambulatory Visit | Attending: Internal Medicine | Admitting: Internal Medicine

## 2020-11-04 DIAGNOSIS — K5730 Diverticulosis of large intestine without perforation or abscess without bleeding: Secondary | ICD-10-CM | POA: Diagnosis not present

## 2020-11-04 DIAGNOSIS — Z79899 Other long term (current) drug therapy: Secondary | ICD-10-CM | POA: Diagnosis not present

## 2020-11-04 DIAGNOSIS — Z981 Arthrodesis status: Secondary | ICD-10-CM | POA: Diagnosis not present

## 2020-11-04 DIAGNOSIS — R195 Other fecal abnormalities: Secondary | ICD-10-CM

## 2020-11-04 DIAGNOSIS — I10 Essential (primary) hypertension: Secondary | ICD-10-CM | POA: Diagnosis not present

## 2020-11-04 DIAGNOSIS — E785 Hyperlipidemia, unspecified: Secondary | ICD-10-CM | POA: Diagnosis not present

## 2020-11-04 DIAGNOSIS — K635 Polyp of colon: Secondary | ICD-10-CM | POA: Diagnosis not present

## 2020-11-04 LAB — BASIC METABOLIC PANEL
Anion gap: 8 (ref 5–15)
BUN: 17 mg/dL (ref 8–23)
CO2: 29 mmol/L (ref 22–32)
Calcium: 9.5 mg/dL (ref 8.9–10.3)
Chloride: 101 mmol/L (ref 98–111)
Creatinine, Ser: 0.77 mg/dL (ref 0.44–1.00)
GFR, Estimated: >60 mL/min (ref >60)
Glucose, Bld: 106 mg/dL — ABNORMAL HIGH (ref 70–99)
Potassium: 4.2 mmol/L (ref 3.5–5.1)
Sodium: 138 mmol/L (ref 135–145)

## 2020-11-07 ENCOUNTER — Encounter (HOSPITAL_COMMUNITY): Payer: Self-pay | Admitting: Internal Medicine

## 2020-11-07 ENCOUNTER — Ambulatory Visit (HOSPITAL_COMMUNITY): Payer: BC Managed Care – PPO | Admitting: Anesthesiology

## 2020-11-07 ENCOUNTER — Encounter (HOSPITAL_COMMUNITY)
Admission: RE | Disposition: A | Discharge status: Home / Self Care | Payer: Self-pay | Source: Home / Self Care | Attending: Internal Medicine

## 2020-11-07 ENCOUNTER — Other Ambulatory Visit: Payer: Self-pay

## 2020-11-07 ENCOUNTER — Ambulatory Visit (HOSPITAL_COMMUNITY)
Admission: RE | Admit: 2020-11-07 | Discharge: 2020-11-07 | Disposition: A | Discharge status: Home / Self Care | Payer: BC Managed Care – PPO | Attending: Internal Medicine | Admitting: Internal Medicine

## 2020-11-07 DIAGNOSIS — Z79899 Other long term (current) drug therapy: Secondary | ICD-10-CM | POA: Insufficient documentation

## 2020-11-07 DIAGNOSIS — K635 Polyp of colon: Secondary | ICD-10-CM | POA: Diagnosis not present

## 2020-11-07 DIAGNOSIS — Z1211 Encounter for screening for malignant neoplasm of colon: Secondary | ICD-10-CM | POA: Diagnosis not present

## 2020-11-07 DIAGNOSIS — I10 Essential (primary) hypertension: Secondary | ICD-10-CM | POA: Diagnosis not present

## 2020-11-07 DIAGNOSIS — R195 Other fecal abnormalities: Secondary | ICD-10-CM | POA: Insufficient documentation

## 2020-11-07 DIAGNOSIS — K5730 Diverticulosis of large intestine without perforation or abscess without bleeding: Secondary | ICD-10-CM | POA: Diagnosis not present

## 2020-11-07 DIAGNOSIS — Z981 Arthrodesis status: Secondary | ICD-10-CM | POA: Insufficient documentation

## 2020-11-07 DIAGNOSIS — D124 Benign neoplasm of descending colon: Secondary | ICD-10-CM

## 2020-11-07 DIAGNOSIS — E785 Hyperlipidemia, unspecified: Secondary | ICD-10-CM | POA: Insufficient documentation

## 2020-11-07 DIAGNOSIS — F418 Other specified anxiety disorders: Secondary | ICD-10-CM | POA: Diagnosis not present

## 2020-11-07 HISTORY — PX: POLYPECTOMY: SHX5525

## 2020-11-07 HISTORY — PX: COLONOSCOPY WITH PROPOFOL: SHX5780

## 2020-11-07 SURGERY — COLONOSCOPY WITH PROPOFOL
Anesthesia: General

## 2020-11-07 MED ORDER — LACTATED RINGERS IV SOLN
INTRAVENOUS | Status: DC
  Administered 2020-11-07: 1000 mL via INTRAVENOUS

## 2020-11-07 MED ORDER — PROPOFOL 500 MG/50ML IV EMUL
INTRAVENOUS | Status: DC | PRN
  Administered 2020-11-07: 150 ug/kg/min via INTRAVENOUS

## 2020-11-07 MED ORDER — LIDOCAINE HCL (CARDIAC) PF 100 MG/5ML IV SOSY
PREFILLED_SYRINGE | INTRAVENOUS | Status: DC | PRN
  Administered 2020-11-07: 50 mg via INTRAVENOUS

## 2020-11-07 MED ORDER — PROPOFOL 10 MG/ML IV BOLUS
INTRAVENOUS | Status: DC | PRN
  Administered 2020-11-07: 50 mg via INTRAVENOUS
  Administered 2020-11-07: 100 mg via INTRAVENOUS

## 2020-11-07 NOTE — H&P (Signed)
@LOGO @   Primary Care Physician:  Sharion Balloon, FNP Primary Gastroenterologist:  Dr. Gala Romney  Pre-Procedure History & Physical: HPI:  Monica Cross is a 65 y.o. female here for further evaluation of a positive Cologuard.  Last colonoscopy some 10 years ago.  Also a recent history of iron deficiency anemia.  Past Medical History:  Diagnosis Date   Anxiety    Arthritis    Left and right hip   Carpal tunnel syndrome    Bilat   Depression    Headache    Hyperlipidemia    Hypertension    Insomnia    patient denies   Osteoporosis    patient denies   Vision abnormalities     Past Surgical History:  Procedure Laterality Date   ANTERIOR CERVICAL DECOMP/DISCECTOMY FUSION N/A 09/14/2020   Procedure: ANTERIOR CERVICAL DECOMPRESSION FUSION CERVICAL 3- CERVICAL 4, CERVICAL 4- CERVICAL 5 WITH INSTRUMENTATION AND ALLOGRAFT;  Surgeon: Phylliss Bob, MD;  Location: [place];  Service: Orthopedics;  Laterality: N/A;   CARPAL TUNNEL RELEASE     CESAREAN SECTION     COSMETIC SURGERY      Prior to Admission medications   Medication Sig Start Date End Date Taking? Authorizing Provider  acetaminophen-codeine (TYLENOL #3) 300-30 MG tablet Take 1-2 tablets by mouth every 6 (six) hours as needed for moderate pain.   Yes [provider]  atorvastatin (LIPITOR) 20 MG tablet Take 1 tablet (20 mg total) by mouth daily. 09/16/20  Yes Hawks, Christy A, FNP  Choline Fenofibrate 135 MG capsule Take 1 capsule (135 mg total) by mouth daily. 09/16/20  Yes Hawks, Christy A, FNP  methocarbamol (ROBAXIN) 500 MG tablet Take 1 tablet (500 mg total) by mouth every 6 (six) hours as needed for muscle spasms. 09/14/20  Yes McKenzie, Kayla J, PA-C  Omega-3 1000 MG CAPS Take 1,000 mg by mouth daily.   Yes [provider]  telmisartan-hydrochlorothiazide (MICARDIS HCT) 80-25 MG tablet Take 1 tablet by mouth daily. 09/16/20  Yes Hawks, Christy A, FNP  HYDROcodone-acetaminophen (NORCO/VICODIN) 5-325 MG tablet Take  1-2 tablets by mouth every 6 (six) hours as needed for moderate pain or severe pain. Patient not taking: Reported on 11/02/2020 09/14/20 09/14/21  Justice Britain, PA-C  SUMAtriptan (IMITREX) 50 MG tablet TAKE ONE TABLET BY MOUTH every TWO hours as needed FOR migraine - MAY REPEAT in [place] if headache persists OR recurs Patient not taking: Reported on 11/02/2020 09/30/18   Terald Sleeper, PA-C    Allergies as of 09/09/2020   (No Known Allergies)    Family History  Problem Relation Age of Onset   Heart disease Mother    Hypertension Mother    Other Father        head injury   Stroke Sister    Osteoporosis Sister    Lung cancer Brother    Liver cancer Brother    Breast cancer Sister    Osteoporosis Sister    COPD Sister    Osteoporosis Sister    Healthy Sister    Osteoporosis Sister    Other Sister        benign brain tumor   Osteoporosis Sister    Colon cancer Neg Hx     Social History   Socioeconomic History   Marital status: Single    Spouse name: Not on file   Number of children: Not on file   Years of education: Not on file   Highest education level: Not on file  Occupational History   Not on file  Tobacco Use   Smoking status: Never   Smokeless tobacco: Never  Vaping Use   Vaping Use: Never used  Substance and Sexual Activity   Alcohol use: No   Drug use: No   Sexual activity: Not on file  Other Topics Concern   Not on file  Social History Narrative   Not on file   Social Determinants of Health   Financial Resource Strain: Not on file  Food Insecurity: Not on file  Transportation Needs: Not on file  Physical Activity: Not on file  Stress: Not on file  Social Connections: Not on file  Intimate Partner Violence: Not on file    Review of Systems: See HPI, otherwise negative ROS  Physical Exam: BP (!) 164/93   Pulse (!) 101   Temp 98.1 F (36.7 C) (Oral)   Resp 15   Ht 5\' 2"  (1.575 m)   Wt 83 kg   SpO2 95%   BMI 33.47 kg/m  General:    Alert,  Well-developed, well-nourished, pleasant and cooperative in NAD Neck:  Supple; no masses or thyromegaly. No significant cervical adenopathy. Lungs:  Clear throughout to auscultation.   No wheezes, crackles, or rhonchi. No acute distress. Heart:  Regular rate and rhythm; no murmurs, clicks, rubs,  or gallops. Abdomen: Non-distended, normal bowel sounds.  Soft and nontender without appreciable mass or hepatosplenomegaly.  Pulses:  Normal pulses noted. Extremities:  Without clubbing or edema.  Impression/Plan: 65 year old lady referred for positive Cologuard.  History of iron deficiency anemia.  Diagnostic colonoscopy today per plan. The risks, benefits, limitations, alternatives and imponderables have been reviewed with the patient. Questions have been answered. All parties are agreeable.      Notice: This dictation was prepared with Dragon dictation along with smaller phrase technology. Any transcriptional errors that result from this process are unintentional and may not be corrected upon review.

## 2020-11-07 NOTE — Op Note (Signed)
[facility] Patient Name: Monica Cross Procedure Date: 11/07/2020 9:42 AM MRN: [id number] Date of Birth: 28-Jul-1955 Attending MD: Norvel Richards , MD CSN: [id number] Age: 65 Admit Type: Outpatient Procedure:                Colonoscopy Indications:              Positive Cologuard test Providers:                Norvel Richards, MD, Janeece Riggers, RN, Dereck Leep, Technician Referring MD:              Medicines:                Propofol per Anesthesia Complications:            No immediate complications. Estimated Blood Loss:     Estimated blood loss was minimal. Procedure:                Pre-Anesthesia Assessment:                           - Prior to the procedure, a History and Physical                            was performed, and patient medications and                            allergies were reviewed. The patient's tolerance of                            previous anesthesia was also reviewed. The risks                            and benefits of the procedure and the sedation                            options and risks were discussed with the patient.                            All questions were answered, and informed consent                            was obtained. Prior Anticoagulants: The patient has                            taken no previous anticoagulant or antiplatelet                            agents. ASA Grade Assessment: II - A patient with                            mild systemic disease. After reviewing the risks  and benefits, the patient was deemed in                            satisfactory condition to undergo the procedure.                           After obtaining informed consent, the colonoscope                            was passed under direct vision. Throughout the                            procedure, the patient's blood pressure, pulse, and                            oxygen  saturations were monitored continuously. The                            [id number] scope was introduced through                            the anus and advanced to the the cecum, identified                            by appendiceal orifice and ileocecal valve. The                            colonoscopy was performed without difficulty. The                            patient tolerated the procedure well. The ileocecal                            valve, appendiceal orifice, and rectum were                            photographed. Scope In: 9:58:19 AM Scope Out: 10:20:09 AM Scope Withdrawal Time: 0 hours 12 minutes 56 seconds  Total Procedure Duration: 0 hours 21 minutes 50 seconds  Findings:      The perianal and digital rectal examinations were normal.      Scattered medium-mouthed diverticula were found in the sigmoid colon and       descending colon.      Two sessile polyps were found in the descending colon and mid transverse       colon. The polyps were 3 to 4 mm in size. These polyps were removed with       a cold snare. Resection and retrieval were complete. Estimated blood       loss was minimal.      The exam was otherwise without abnormality on direct and retroflexion       views. Impression:               - Diverticulosis in the sigmoid colon and in the  descending colon.                           - Two 3 to 4 mm polyps in the descending colon, mid                            transverse colon, removed with a cold snare.                            Resected and retrieved.                           - The examination was otherwise normal on direct                            and retroflexion views. Moderate Sedation:      Moderate (conscious) sedation was personally administered by an       anesthesia professional. The following parameters were monitored: oxygen       saturation, heart rate, blood pressure, and response to care. Recommendation:            - Patient has a contact number available for                            emergencies. The signs and symptoms of potential                            delayed complications were discussed with the                            patient. Return to normal activities tomorrow.                            Written discharge instructions were provided to the                            patient.                           - Resume previous diet.                           - Continue present medications.                           - Repeat colonoscopy date to be determined after                            pending pathology results are reviewed for                            surveillance.                           - Return to GI office (date not yet determined). Procedure Code(s):        --- Professional ---  45385, Colonoscopy, flexible; with removal of                            tumor(s), polyp(s), or other lesion(s) by snare                            technique Diagnosis Code(s):        --- Professional ---                           K63.5, Polyp of colon                           R19.5, Other fecal abnormalities                           K57.30, Diverticulosis of large intestine without                            perforation or abscess without bleeding CPT copyright 2019 [employer]. All rights reserved. The codes documented in this report are preliminary and upon coder review may  be revised to meet current compliance requirements. Cristopher Estimable. Analie Katzman, MD Norvel Richards, MD 11/07/2020 10:31:28 AM This report has been signed electronically. Number of Addenda: 0

## 2020-11-07 NOTE — Anesthesia Postprocedure Evaluation (Signed)
Anesthesia Post Note  Patient: Monica Cross  Procedure(s) Performed: COLONOSCOPY WITH PROPOFOL POLYPECTOMY  Patient location during evaluation: Endoscopy Anesthesia Type: General Level of consciousness: awake and alert and oriented Pain management: pain level controlled Vital Signs Assessment: post-procedure vital signs reviewed and stable Respiratory status: spontaneous breathing and respiratory function stable Cardiovascular status: blood pressure returned to baseline and stable Postop Assessment: no apparent nausea or vomiting Anesthetic complications: no   No notable events documented.   Last Vitals:  Vitals:   11/07/20 0900 11/07/20 1023  BP: (!) 164/93 118/62  Pulse: (!) 101 92  Resp: 15 18  Temp: 36.7 C (!) 36.3 C  SpO2: 95% 97%    Last Pain:  Vitals:   11/07/20 1023  TempSrc: Axillary  PainSc: 0-No pain                 Carissa Musick C Zoey Gilkeson

## 2020-11-07 NOTE — Discharge Instructions (Signed)
  Colonoscopy Discharge Instructions  Read the instructions outlined below and refer to this sheet in the next few weeks. These discharge instructions provide you with general information on caring for yourself after you leave the hospital. Your doctor may also give you specific instructions. While your treatment has been planned according to the most current medical practices available, unavoidable complications occasionally occur. If you have any problems or questions after discharge, call Dr. Gala Romney at [phone]. ACTIVITY You may resume your regular activity, but move at a slower pace for the next 24 hours.  Take frequent rest periods for the next 24 hours.  Walking will help get rid of the air and reduce the bloated feeling in your belly (abdomen).  No driving for 24 hours (because of the medicine (anesthesia) used during the test).   Do not sign any important legal documents or operate any machinery for 24 hours (because of the anesthesia used during the test).  NUTRITION Drink plenty of fluids.  You may resume your normal diet as instructed by your doctor.  Begin with a light meal and progress to your normal diet. Heavy or fried foods are harder to digest and may make you feel sick to your stomach (nauseated).  Avoid alcoholic beverages for 24 hours or as instructed.  MEDICATIONS You may resume your normal medications unless your doctor tells you otherwise.  WHAT YOU CAN EXPECT TODAY Some feelings of bloating in the abdomen.  Passage of more gas than usual.  Spotting of blood in your stool or on the toilet paper.  IF YOU HAD POLYPS REMOVED DURING THE COLONOSCOPY: No aspirin products for 7 days or as instructed.  No alcohol for 7 days or as instructed.  Eat a soft diet for the next 24 hours.  FINDING OUT THE RESULTS OF YOUR TEST Not all test results are available during your visit. If your test results are not back during the visit, make an appointment with your caregiver to find out the  results. Do not assume everything is normal if you have not heard from your caregiver or the medical facility. It is important for you to follow up on all of your test results.  SEEK IMMEDIATE MEDICAL ATTENTION IF: You have more than a spotting of blood in your stool.  Your belly is swollen (abdominal distention).  You are nauseated or vomiting.  You have a temperature over 101.  You have abdominal pain or discomfort that is severe or gets worse throughout the day.     2 polyps removed in your colon today  Polyp and diverticulosis information provided  Further recommendations to follow pending review of pathology report  At patient request, I called Macario Golds at [phone] results

## 2020-11-07 NOTE — Anesthesia Procedure Notes (Signed)
Date/Time: 11/07/2020 9:58 AM Performed by: Orlie Dakin, CRNA Pre-anesthesia Checklist: Patient identified, Emergency Drugs available, Suction available and Patient being monitored Patient Re-evaluated:Patient Re-evaluated prior to induction Oxygen Delivery Method: Nasal cannula Induction Type: IV induction Placement Confirmation: positive ETCO2

## 2020-11-07 NOTE — Anesthesia Preprocedure Evaluation (Addendum)
Anesthesia Evaluation  Patient identified by MRN, date of birth, ID band Patient awake    Reviewed: Allergy & Precautions, NPO status , Patient's Chart, lab work & pertinent test results  History of Anesthesia Complications Negative for: history of anesthetic complications  Airway Mallampati: III  TM Distance: >3 FB Neck ROM: Full  Mouth opening: Limited Mouth Opening Comment: ACDF Dental  (+) Edentulous Upper, Edentulous Lower   Pulmonary neg pulmonary ROS,    Pulmonary exam normal breath sounds clear to auscultation       Cardiovascular hypertension, Pt. on medications Normal cardiovascular exam Rhythm:Regular Rate:Normal     Neuro/Psych  Headaches, PSYCHIATRIC DISORDERS Anxiety Depression  Neuromuscular disease    GI/Hepatic negative GI ROS, Neg liver ROS,   Endo/Other  negative endocrine ROS  Renal/GU negative Renal ROS     Musculoskeletal  (+) Arthritis ,   Abdominal   Peds  Hematology   Anesthesia Other Findings ACDF  Reproductive/Obstetrics                            Anesthesia Physical Anesthesia Plan  ASA: 2  Anesthesia Plan: General   Post-op Pain Management:    Induction: Intravenous  PONV Risk Score and Plan: TIVA  Airway Management Planned: Nasal Cannula and Natural Airway  Additional Equipment:   Intra-op Plan:   Post-operative Plan:   Informed Consent: I have reviewed the patients History and Physical, chart, labs and discussed the procedure including the risks, benefits and alternatives for the proposed anesthesia with the patient or authorized representative who has indicated his/her understanding and acceptance.       Plan Discussed with: CRNA and Surgeon  Anesthesia Plan Comments:        Anesthesia Quick Evaluation

## 2020-11-07 NOTE — Transfer of Care (Signed)
Immediate Anesthesia Transfer of Care Note  Patient: Monica Cross  Procedure(s) Performed: COLONOSCOPY WITH PROPOFOL POLYPECTOMY  Patient Location: Endoscopy Unit  Anesthesia Type:General  Level of Consciousness: awake  Airway & Oxygen Therapy: Patient Spontanous Breathing  Post-op Assessment: Report given to RN and Post -op Vital signs reviewed and stable  Post vital signs: Reviewed and stable  Last Vitals:  Vitals Value Taken Time  BP    Temp    Pulse    Resp    SpO2      Last Pain:  Vitals:   11/07/20 0953  TempSrc:   PainSc: 7       Patients Stated Pain Goal: 7 (53/91/22 5834)  Complications: No notable events documented.

## 2020-11-08 LAB — SURGICAL PATHOLOGY

## 2020-11-09 ENCOUNTER — Other Ambulatory Visit: Payer: Self-pay | Admitting: Family

## 2020-11-09 ENCOUNTER — Encounter: Payer: Self-pay | Admitting: Internal Medicine

## 2020-11-09 ENCOUNTER — Ambulatory Visit: Payer: BC Managed Care – PPO | Attending: Orthopedic Surgery | Admitting: Physical Therapy

## 2020-11-09 ENCOUNTER — Encounter: Payer: Self-pay | Admitting: Physical Therapy

## 2020-11-09 ENCOUNTER — Other Ambulatory Visit: Payer: Self-pay

## 2020-11-09 DIAGNOSIS — R2681 Unsteadiness on feet: Secondary | ICD-10-CM | POA: Diagnosis not present

## 2020-11-09 DIAGNOSIS — M542 Cervicalgia: Secondary | ICD-10-CM | POA: Diagnosis not present
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
## 2020-11-10 ENCOUNTER — Encounter (HOSPITAL_COMMUNITY): Payer: Self-pay | Admitting: Internal Medicine

## 2020-11-14 ENCOUNTER — Ambulatory Visit: Payer: BC Managed Care – PPO | Attending: Orthopedic Surgery | Admitting: Physical Therapy

## 2020-11-14 ENCOUNTER — Encounter: Payer: Self-pay | Admitting: Physical Therapy

## 2020-11-14 ENCOUNTER — Other Ambulatory Visit: Payer: Self-pay

## 2020-11-14 DIAGNOSIS — M542 Cervicalgia: Secondary | ICD-10-CM | POA: Insufficient documentation

## 2020-11-14 DIAGNOSIS — R2681 Unsteadiness on feet: Secondary | ICD-10-CM | POA: Insufficient documentation
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
## 2020-11-17 ENCOUNTER — Encounter: Payer: Self-pay | Admitting: Physical Therapy

## 2020-11-17 ENCOUNTER — Other Ambulatory Visit: Payer: Self-pay

## 2020-11-17 ENCOUNTER — Ambulatory Visit: Payer: BC Managed Care – PPO | Admitting: Physical Therapy

## 2020-11-17 DIAGNOSIS — R2681 Unsteadiness on feet: Secondary | ICD-10-CM | POA: Diagnosis not present

## 2020-11-17 DIAGNOSIS — M542 Cervicalgia: Secondary | ICD-10-CM
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
## 2020-11-21 ENCOUNTER — Ambulatory Visit: Payer: BC Managed Care – PPO

## 2020-11-21 ENCOUNTER — Other Ambulatory Visit: Payer: Self-pay

## 2020-11-21 DIAGNOSIS — R2681 Unsteadiness on feet: Secondary | ICD-10-CM | POA: Diagnosis not present

## 2020-11-21 DIAGNOSIS — M542 Cervicalgia: Secondary | ICD-10-CM
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
## 2020-11-24 ENCOUNTER — Ambulatory Visit: Payer: BC Managed Care – PPO

## 2020-11-24 ENCOUNTER — Other Ambulatory Visit: Payer: Self-pay

## 2020-11-24 DIAGNOSIS — R2681 Unsteadiness on feet: Secondary | ICD-10-CM | POA: Diagnosis not present

## 2020-11-24 DIAGNOSIS — M542 Cervicalgia: Secondary | ICD-10-CM | POA: Diagnosis not present
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
## 2020-11-28 ENCOUNTER — Ambulatory Visit: Payer: BC Managed Care – PPO

## 2020-11-28 ENCOUNTER — Other Ambulatory Visit: Payer: Self-pay

## 2020-11-28 DIAGNOSIS — R2681 Unsteadiness on feet: Secondary | ICD-10-CM | POA: Diagnosis not present

## 2020-11-28 DIAGNOSIS — M542 Cervicalgia: Secondary | ICD-10-CM
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
## 2020-11-29 ENCOUNTER — Ambulatory Visit (INDEPENDENT_AMBULATORY_CARE_PROVIDER_SITE_OTHER): Payer: BC Managed Care – PPO | Admitting: Family

## 2020-11-29 ENCOUNTER — Encounter: Payer: Self-pay | Admitting: Family

## 2020-11-29 VITALS — BP 147/83 | HR 85 | Temp 97.7°F | Ht 62.0 in | Wt 184.8 lb

## 2020-11-29 DIAGNOSIS — F321 Major depressive disorder, single episode, moderate: Secondary | ICD-10-CM

## 2020-11-29 DIAGNOSIS — E7800 Pure hypercholesterolemia, unspecified: Secondary | ICD-10-CM

## 2020-11-29 DIAGNOSIS — R519 Headache, unspecified: Secondary | ICD-10-CM

## 2020-11-29 DIAGNOSIS — Z23 Encounter for immunization: Secondary | ICD-10-CM

## 2020-11-29 DIAGNOSIS — I10 Essential (primary) hypertension: Secondary | ICD-10-CM | POA: Diagnosis not present

## 2020-11-29 DIAGNOSIS — M170 Bilateral primary osteoarthritis of knee: Secondary | ICD-10-CM

## 2020-11-29 DIAGNOSIS — G4700 Insomnia, unspecified: Secondary | ICD-10-CM

## 2020-11-29 DIAGNOSIS — F411 Generalized anxiety disorder: Secondary | ICD-10-CM

## 2020-11-29 DIAGNOSIS — K5900 Constipation, unspecified: Secondary | ICD-10-CM

## 2020-11-29 MED ORDER — SUMATRIPTAN SUCCINATE 50 MG PO TABS: ORAL_TABLET | ORAL | 5 refills | Status: DC

## 2020-11-29 MED ORDER — METHOCARBAMOL 500 MG PO TABS: 500.0000 mg | ORAL_TABLET | Freq: Four times a day (QID) | ORAL | 0 refills | Status: DC | PRN

## 2020-11-29 MED ORDER — TELMISARTAN-HCTZ 80-25 MG PO TABS: 1.0000 | ORAL_TABLET | Freq: Every day | ORAL | 0 refills | Status: DC

## 2020-11-29 NOTE — Patient Instructions (Signed)

## 2020-11-29 NOTE — Progress Notes (Signed)
Subjective:    Patient ID: Monica Cross, female    DOB: [birth year], 65 y.o.   MRN: [id number]  Chief Complaint  Patient presents with   Medical Management of Chronic Issues    Wants some muscle relaxer for her legs.    Pt presents to the  office today for chronic follow up. She is followed by Ortho for joint pain. She had a cervical decompression fusion of C 3-5 on 09/14/20. She reports she is still working with PT.   She had colonoscopy on 11/07/20 that was negative.  Hypertension This is a chronic problem. The current episode started more than 1 year ago. The problem has been resolved since onset. The problem is controlled. Associated symptoms include anxiety and malaise/fatigue. Pertinent negatives include no peripheral edema or shortness of breath. Risk factors for coronary artery disease include dyslipidemia, obesity and sedentary lifestyle. The current treatment provides moderate improvement.  Arthritis Presents for follow-up visit. She complains of pain. The symptoms have been stable. Affected locations include the left knee, right knee and neck. Her pain is at a severity of 4/10.  Insomnia Primary symptoms: difficulty falling asleep, frequent awakening, malaise/fatigue.   The current episode started more than one year. The onset quality is gradual. The problem occurs intermittently. PMH includes: depression.   Hyperlipidemia This is a chronic problem. The current episode started more than 1 year ago. The problem is controlled. Exacerbating diseases include obesity. Pertinent negatives include no shortness of breath. Current antihyperlipidemic treatment includes statins. The current treatment provides moderate improvement of lipids. Risk factors for coronary artery disease include dyslipidemia, diabetes mellitus, hypertension, a sedentary lifestyle and post-menopausal.  Depression        This is a chronic problem.  The current episode started more than 1 year ago.   Associated  symptoms include insomnia, irritable, restlessness and sad.  Associated symptoms include no helplessness and no hopelessness.  Past treatments include nothing.  Past medical history includes anxiety.   Anxiety Presents for follow-up visit. Symptoms include depressed mood, excessive worry, insomnia, irritability, nervous/anxious behavior and restlessness. Patient reports no shortness of breath. The severity of symptoms is moderate. The quality of sleep is good.       Review of Systems  Constitutional:  Positive for irritability and malaise/fatigue.  Respiratory:  Negative for shortness of breath.   Musculoskeletal:  Positive for arthritis.  Psychiatric/Behavioral:  Positive for depression. The patient is nervous/anxious and has insomnia.   All other systems reviewed and are negative.     Objective:   Physical Exam Vitals reviewed.  Constitutional:      General: She is irritable. She is not in acute distress.    Appearance: She is well-developed. She is obese.  HENT:     Head: Normocephalic and atraumatic.     Right Ear: Tympanic membrane normal.     Left Ear: Tympanic membrane normal.  Eyes:     Pupils: Pupils are equal, round, and reactive to light.  Neck:     Thyroid: No thyromegaly.  Cardiovascular:     Rate and Rhythm: Normal rate and regular rhythm.     Heart sounds: Normal heart sounds. No murmur heard. Pulmonary:     Effort: Pulmonary effort is normal. No respiratory distress.     Breath sounds: Normal breath sounds. No wheezing.  Abdominal:     General: Bowel sounds are normal. There is no distension.     Palpations: Abdomen is soft.     Tenderness: There is no  abdominal tenderness.  Musculoskeletal:        General: No tenderness.     Cervical back: Normal range of motion and neck supple.     Comments: Pain in neck with flexion and extension  Skin:    General: Skin is warm and dry.  Neurological:     Mental Status: She is alert and oriented to person, place, and  time.     Cranial Nerves: No cranial nerve deficit.     Deep Tendon Reflexes: Reflexes are normal and symmetric.  Psychiatric:        Behavior: Behavior normal.        Thought Content: Thought content normal.        Judgment: Judgment normal.      BP (!) 147/83   Pulse 85   Temp 97.7 F (36.5 C) (Temporal)   Ht 5\' 2"  (1.575 m)   Wt 184 lb 12.8 oz (83.8 kg)   BMI 33.80 kg/m      Assessment & Plan:  Caryle Helgeson comes in today with chief complaint of Medical Management of Chronic Issues (Wants some muscle relaxer for her legs. )   Diagnosis and orders addressed:  1. Nonintractable headache, unspecified chronicity pattern, unspecified headache type - SUMAtriptan (IMITREX) 50 MG tablet; TAKE ONE TABLET BY MOUTH every TWO hours as needed FOR migraine - MAY REPEAT in TWO HOURS if headache persists OR recurs  Dispense: 9 tablet; Refill: 5  2. Primary hypertension - telmisartan-hydrochlorothiazide (MICARDIS HCT) 80-25 MG tablet; Take 1 tablet by mouth daily. (NEEDS TO BE SEEN BEFORE NEXT REFILL)  Dispense: 30 tablet; Refill: 0  3. Primary osteoarthritis of both knees  4. Generalized anxiety disorder  5. Insomnia, unspecified type  6. Depression, major, single episode, moderate ([place])  7. Pure hypercholesterolemia   8. Constipation, unspecified constipation type  9. Need for shingles vaccine  - Varicella-zoster vaccine IM (Shingrix)   Labs pending Health Maintenance reviewed Diet and exercise encouraged  Follow up plan: 3 months    Evelina Dun, FNP

## 2020-12-01 ENCOUNTER — Ambulatory Visit: Payer: BC Managed Care – PPO

## 2020-12-01 ENCOUNTER — Other Ambulatory Visit: Payer: Self-pay

## 2020-12-01 DIAGNOSIS — R2681 Unsteadiness on feet: Secondary | ICD-10-CM | POA: Diagnosis not present

## 2020-12-01 DIAGNOSIS — M542 Cervicalgia: Secondary | ICD-10-CM
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
## 2020-12-05 ENCOUNTER — Ambulatory Visit: Payer: BC Managed Care – PPO | Admitting: *Deleted

## 2020-12-05 ENCOUNTER — Other Ambulatory Visit: Payer: Self-pay

## 2020-12-05 DIAGNOSIS — M542 Cervicalgia: Secondary | ICD-10-CM

## 2020-12-05 DIAGNOSIS — R2681 Unsteadiness on feet: Secondary | ICD-10-CM | POA: Diagnosis not present
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
## 2020-12-06 DIAGNOSIS — Z9889 Other specified postprocedural states: Secondary | ICD-10-CM | POA: Diagnosis not present

## 2020-12-08 ENCOUNTER — Ambulatory Visit: Payer: BC Managed Care – PPO

## 2020-12-08 ENCOUNTER — Other Ambulatory Visit: Payer: Self-pay

## 2020-12-08 DIAGNOSIS — R2681 Unsteadiness on feet: Secondary | ICD-10-CM | POA: Diagnosis not present

## 2020-12-08 DIAGNOSIS — M542 Cervicalgia: Secondary | ICD-10-CM | POA: Diagnosis not present
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
## 2020-12-12 ENCOUNTER — Other Ambulatory Visit: Payer: Self-pay

## 2020-12-12 ENCOUNTER — Ambulatory Visit: Payer: BC Managed Care – PPO

## 2020-12-12 DIAGNOSIS — R2681 Unsteadiness on feet: Secondary | ICD-10-CM

## 2020-12-12 DIAGNOSIS — M542 Cervicalgia: Secondary | ICD-10-CM | POA: Diagnosis not present
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
## 2021-01-09 ENCOUNTER — Other Ambulatory Visit: Payer: Self-pay | Admitting: Family

## 2021-01-09 DIAGNOSIS — I10 Essential (primary) hypertension: Secondary | ICD-10-CM

## 2021-02-17 ENCOUNTER — Other Ambulatory Visit: Payer: Self-pay | Admitting: Family

## 2021-03-02 ENCOUNTER — Ambulatory Visit: Payer: BC Managed Care – PPO | Admitting: Family

## 2021-03-03 ENCOUNTER — Ambulatory Visit: Payer: BC Managed Care – PPO | Admitting: Family

## 2021-03-09 ENCOUNTER — Other Ambulatory Visit: Payer: Self-pay | Admitting: Family

## 2021-03-09 DIAGNOSIS — I10 Essential (primary) hypertension: Secondary | ICD-10-CM

## 2021-03-10 DIAGNOSIS — M542 Cervicalgia: Secondary | ICD-10-CM | POA: Diagnosis not present

## 2021-03-17 ENCOUNTER — Encounter: Payer: Self-pay | Admitting: Family

## 2021-03-17 ENCOUNTER — Ambulatory Visit (INDEPENDENT_AMBULATORY_CARE_PROVIDER_SITE_OTHER): Payer: 59 | Admitting: Family

## 2021-03-17 VITALS — BP 111/70 | HR 100 | Temp 97.1°F | Ht 62.0 in | Wt 194.6 lb

## 2021-03-17 DIAGNOSIS — I10 Essential (primary) hypertension: Secondary | ICD-10-CM | POA: Diagnosis not present

## 2021-03-17 DIAGNOSIS — K5900 Constipation, unspecified: Secondary | ICD-10-CM | POA: Diagnosis not present

## 2021-03-17 DIAGNOSIS — G8929 Other chronic pain: Secondary | ICD-10-CM

## 2021-03-17 DIAGNOSIS — M170 Bilateral primary osteoarthritis of knee: Secondary | ICD-10-CM | POA: Diagnosis not present

## 2021-03-17 DIAGNOSIS — F321 Major depressive disorder, single episode, moderate: Secondary | ICD-10-CM

## 2021-03-17 DIAGNOSIS — G4700 Insomnia, unspecified: Secondary | ICD-10-CM

## 2021-03-17 DIAGNOSIS — Z23 Encounter for immunization: Secondary | ICD-10-CM | POA: Diagnosis not present

## 2021-03-17 DIAGNOSIS — E7800 Pure hypercholesterolemia, unspecified: Secondary | ICD-10-CM

## 2021-03-17 DIAGNOSIS — M542 Cervicalgia: Secondary | ICD-10-CM

## 2021-03-17 DIAGNOSIS — F411 Generalized anxiety disorder: Secondary | ICD-10-CM

## 2021-03-17 DIAGNOSIS — L910 Hypertrophic scar: Secondary | ICD-10-CM

## 2021-03-17 MED ORDER — DULOXETINE HCL 30 MG PO CPEP: ORAL_CAPSULE | ORAL | 0 refills | Status: DC

## 2021-03-17 MED ORDER — BACLOFEN 10 MG PO TABS: 10.0000 mg | ORAL_TABLET | Freq: Three times a day (TID) | ORAL | 2 refills | Status: DC

## 2021-03-17 MED ORDER — DULOXETINE HCL 60 MG PO CPEP: 60.0000 mg | ORAL_CAPSULE | Freq: Every day | ORAL | 0 refills | Status: DC

## 2021-03-17 MED ORDER — TRAMADOL HCL 50 MG PO TABS: 50.0000 mg | ORAL_TABLET | Freq: Three times a day (TID) | ORAL | 0 refills | Status: DC | PRN

## 2021-03-17 MED ORDER — TRAZODONE HCL 50 MG PO TABS
50.0000 mg | ORAL_TABLET | Freq: Every evening | ORAL | 3 refills | Status: DC | PRN
Start: 2021-03-17 — End: 2021-08-04
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
## 2021-03-17 NOTE — Patient Instructions (Signed)

## 2021-03-18 LAB — CBC WITH DIFFERENTIAL/PLATELET
Basophils Absolute: 0 10*3/uL (ref 0.0–0.2)
Basos: 1 %
EOS (ABSOLUTE): 0.1 10*3/uL (ref 0.0–0.4)
Eos: 2 %
Hematocrit: 37.6 % (ref 34.0–46.6)
Hemoglobin: 12 g/dL (ref 11.1–15.9)
Immature Grans (Abs): 0 10*3/uL (ref 0.0–0.1)
Immature Granulocytes: 0 %
Lymphocytes Absolute: 2.2 10*3/uL (ref 0.7–3.1)
Lymphs: 39 %
MCH: 29.4 pg (ref 26.6–33.0)
MCHC: 31.9 g/dL (ref 31.5–35.7)
MCV: 92 fL (ref 79–97)
Monocytes Absolute: 0.4 10*3/uL (ref 0.1–0.9)
Monocytes: 8 %
Neutrophils Absolute: 2.8 10*3/uL (ref 1.4–7.0)
Neutrophils: 50 %
Platelets: 430 10*3/uL (ref 150–450)
RBC: 4.08 x10E6/uL (ref 3.77–5.28)
RDW: 13.5 % (ref 11.7–15.4)
WBC: 5.6 10*3/uL (ref 3.4–10.8)

## 2021-03-18 LAB — CMP14+EGFR
ALT: 16 IU/L (ref 0–32)
AST: 13 IU/L (ref 0–40)
Albumin/Globulin Ratio: 1.6 (ref 1.2–2.2)
Albumin: 3.9 g/dL (ref 3.8–4.8)
Alkaline Phosphatase: 66 IU/L (ref 44–121)
BUN/Creatinine Ratio: 20 (ref 12–28)
BUN: 18 mg/dL (ref 8–27)
Bilirubin Total: <0.2 mg/dL (ref 0.0–1.2)
CO2: 30 mmol/L — ABNORMAL HIGH (ref 20–29)
Calcium: 9.4 mg/dL (ref 8.7–10.3)
Chloride: 102 mmol/L (ref 96–106)
Creatinine, Ser: 0.92 mg/dL (ref 0.57–1.00)
Globulin, Total: 2.5 g/dL (ref 1.5–4.5)
Glucose: 81 mg/dL (ref 70–99)
Potassium: 5.2 mmol/L (ref 3.5–5.2)
Sodium: 143 mmol/L (ref 134–144)
Total Protein: 6.4 g/dL (ref 6.0–8.5)
eGFR: 70 mL/min/{1.73_m2} (ref >59)

## 2021-03-31 ENCOUNTER — Other Ambulatory Visit: Payer: Self-pay | Admitting: Family

## 2021-03-31 DIAGNOSIS — M542 Cervicalgia: Secondary | ICD-10-CM

## 2021-03-31 DIAGNOSIS — G8929 Other chronic pain: Secondary | ICD-10-CM

## 2021-04-14 ENCOUNTER — Ambulatory Visit (INDEPENDENT_AMBULATORY_CARE_PROVIDER_SITE_OTHER): Payer: 59 | Admitting: Family

## 2021-04-14 ENCOUNTER — Encounter: Payer: Self-pay | Admitting: Family

## 2021-04-14 VITALS — BP 134/80 | HR 83 | Temp 97.3°F | Ht 62.0 in | Wt 189.6 lb

## 2021-04-14 DIAGNOSIS — F321 Major depressive disorder, single episode, moderate: Secondary | ICD-10-CM

## 2021-04-14 DIAGNOSIS — M542 Cervicalgia: Secondary | ICD-10-CM

## 2021-04-14 DIAGNOSIS — G43109 Migraine with aura, not intractable, without status migrainosus: Secondary | ICD-10-CM | POA: Diagnosis not present

## 2021-04-14 DIAGNOSIS — G8929 Other chronic pain: Secondary | ICD-10-CM

## 2021-04-14 DIAGNOSIS — F411 Generalized anxiety disorder: Secondary | ICD-10-CM

## 2021-04-14 DIAGNOSIS — G4700 Insomnia, unspecified: Secondary | ICD-10-CM | POA: Diagnosis not present

## 2021-04-14 DIAGNOSIS — R519 Headache, unspecified: Secondary | ICD-10-CM

## 2021-04-14 MED ORDER — SUMATRIPTAN SUCCINATE 50 MG PO TABS: ORAL_TABLET | ORAL | 5 refills | Status: DC

## 2021-04-14 MED ORDER — TOPIRAMATE 25 MG PO TABS: ORAL_TABLET | ORAL | 0 refills | Status: DC

## 2021-04-14 NOTE — Progress Notes (Signed)
? ?Subjective:  ? ? Patient ID: Monica Cross, female    DOB: 09-18-1955, 66 y.o.   MRN: [id number] ? ?Pt presents to the office today to recheck GAD, depression, insomnia. And pain. She was seen on 03/17/21 and we started Cymbalta 30 mg for 2 weeks then increased to 60 mg. She reports her anxiety and depression is better. She reports she still needs to get use to being retired.  ? ?We also started her on trazodone 50 mg and states this has greatly helped her.  ? ?She also has chronic neck pain and we started her on Ultram as needed. This has helped. Reports her pain intermittent aching pain of 7 out 10.  ? ?She has hx of migraines and takes imitrex as needed. States this is not working and getting headaches daily.  ?Anxiety ?Presents for follow-up visit. Symptoms include depressed mood, excessive worry, insomnia, irritability, nervous/anxious behavior and restlessness. Patient reports no nausea. Symptoms occur occasionally. The severity of symptoms is moderate.  ? ? ?Depression ?       This is a chronic problem.  The current episode started more than 1 year ago.   Associated symptoms include insomnia, restlessness and headaches.  Associated symptoms include no helplessness, no hopelessness and not sad.  Past treatments include SNRIs - Serotonin and norepinephrine reuptake inhibitors.  Past medical history includes anxiety.   ?Insomnia ?Primary symptoms: difficulty falling asleep, frequent awakening.   ?The onset quality is gradual. The problem occurs intermittently. PMH includes: depression.   ?Headache  ?This is a chronic problem. The current episode started more than 1 year ago. The problem occurs daily. The pain is located in the Occipital region. The pain quality is similar to prior headaches. The quality of the pain is described as throbbing. Associated symptoms include insomnia, phonophobia and photophobia. Pertinent negatives include no nausea or numbness. She has tried triptans for the symptoms. The  treatment provided no relief.  ? ? ? ?Review of Systems  ?Constitutional:  Positive for irritability.  ?Eyes:  Positive for photophobia.  ?Gastrointestinal:  Negative for nausea.  ?Neurological:  Positive for headaches. Negative for numbness.  ?Psychiatric/Behavioral:  Positive for depression. The patient is nervous/anxious and has insomnia.   ?All other systems reviewed and are negative. ? ?   ?Objective:  ? Physical Exam ?Vitals reviewed.  ?Constitutional:   ?   General: She is not in acute distress. ?   Appearance: She is well-developed. She is obese.  ?HENT:  ?   Head: Normocephalic and atraumatic.  ?Eyes:  ?   Pupils: Pupils are equal, round, and reactive to light.  ?Neck:  ?   Thyroid: No thyromegaly.  ?Cardiovascular:  ?   Rate and Rhythm: Normal rate and regular rhythm.  ?   Heart sounds: Normal heart sounds. No murmur heard. ?Pulmonary:  ?   Effort: Pulmonary effort is normal. No respiratory distress.  ?   Breath sounds: Normal breath sounds. No wheezing.  ?Abdominal:  ?   General: Bowel sounds are normal. There is no distension.  ?   Palpations: Abdomen is soft.  ?   Tenderness: There is no abdominal tenderness.  ?Musculoskeletal:     ?   General: No tenderness. Normal range of motion.  ?   Cervical back: Normal range of motion and neck supple.  ?Skin: ?   General: Skin is warm and dry.  ?Neurological:  ?   Mental Status: She is alert and oriented to person, place, and time.  ?  Cranial Nerves: No cranial nerve deficit.  ?   Deep Tendon Reflexes: Reflexes are normal and symmetric.  ?Psychiatric:     ?   Behavior: Behavior normal.     ?   Thought Content: Thought content normal.     ?   Judgment: Judgment normal.  ? ? ? ? ?BP 134/80 Comment: at home  Pulse 83   Temp (!) 97.3 ?F (36.3 ?C) (Temporal)   Ht 5\' 2"  (1.575 m)   Wt 189 lb 9.6 oz (86 kg)   BMI 34.68 kg/m?  ? ?   ?Assessment & Plan:  ?Lashanda Storlie comes in today with chief complaint of Follow-up (GAD, Depression, Pain, Insomnia. Wants  to  change her IMITREX because she is getting more migraines lately ) ? ? ?Diagnosis and orders addressed: ? ?1. Migraine with aura and without status migrainosus, not intractable ? ? ?2. Insomnia, unspecified type ? ?3. Generalized anxiety disorder ? ?4. Depression, major, single episode, moderate ([place]) ? ?5. Chronic neck pain ? ?6. Nonintractable headache, unspecified chronicity pattern, unspecified headache type ?Will add Topamax 25 mg BID then increase 40 mg  ?Stress management  ?- SUMAtriptan (IMITREX) 50 MG tablet; TAKE ONE TABLET BY MOUTH every TWO hours as needed FOR migraine - MAY REPEAT in TWO HOURS if headache persists OR recurs  Dispense: 9 tablet; Refill: 5 ? ?Continue current medications  ?Health Maintenance reviewed ?Diet and exercise encouraged ? ?Follow up plan: ?6 weeks  ? ? ?Evelina Dun, FNP ? ? ? ?

## 2021-04-14 NOTE — Patient Instructions (Signed)
Migraine Headache A migraine headache is an intense, throbbing pain on one side or both sides of the head. Migraine headaches may also cause other symptoms, such as nausea, vomiting, and sensitivity to light and noise. A migraine headache can last from 4 hours to 3 days. Talk with your doctor about what things may bring on (trigger) your migraine headaches. What are the causes? The exact cause of this condition is not known. However, a migraine may be caused when nerves in the brain become irritated and release chemicals that cause inflammation of blood vessels. This inflammation causes pain. This condition may be triggered or caused by: Drinking alcohol. Smoking. Taking medicines, such as: Medicine used to treat chest pain (nitroglycerin). Birth control pills. Estrogen. Certain blood pressure medicines. Eating or drinking products that contain nitrates, glutamate, aspartame, or tyramine. Aged cheeses, chocolate, or caffeine may also be triggers. Doing physical activity. Other things that may trigger a migraine headache include: Menstruation. Pregnancy. Hunger. Stress. Lack of sleep or too much sleep. Weather changes. Fatigue. What increases the risk? The following factors may make you more likely to experience migraine headaches: Being a certain age. This condition is more common in people who are 25-55 years old. Being female. Having a family history of migraine headaches. Being Caucasian. Having a mental health condition, such as depression or anxiety. Being obese. What are the signs or symptoms? The main symptom of this condition is pulsating or throbbing pain. This pain may: Happen in any area of the head, such as on one side or both sides. Interfere with daily activities. Get worse with physical activity. Get worse with exposure to bright lights or loud noises. Other symptoms may include: Nausea. Vomiting. Dizziness. General sensitivity to bright lights, loud noises, or  smells. Before you get a migraine headache, you may get warning signs (an aura). An aura may include: Seeing flashing lights or having blind spots. Seeing bright spots, halos, or zigzag lines. Having tunnel vision or blurred vision. Having numbness or a tingling feeling. Having trouble talking. Having muscle weakness. Some people have symptoms after a migraine headache (postdromal phase), such as: Feeling tired. Difficulty concentrating. How is this diagnosed? A migraine headache can be diagnosed based on: Your symptoms. A physical exam. Tests, such as: CT scan or an MRI of the head. These imaging tests can help rule out other causes of headaches. Taking fluid from the spine (lumbar puncture) and analyzing it (cerebrospinal fluid analysis, or CSF analysis). How is this treated? This condition may be treated with medicines that: Relieve pain. Relieve nausea. Prevent migraine headaches. Treatment for this condition may also include: Acupuncture. Lifestyle changes like avoiding foods that trigger migraine headaches. Biofeedback. Cognitive behavioral therapy. Follow these instructions at home: Medicines Take over-the-counter and prescription medicines only as told by your health care provider. Ask your health care provider if the medicine prescribed to you: Requires you to avoid driving or using heavy machinery. Can cause constipation. You may need to take these actions to prevent or treat constipation: Drink enough fluid to keep your urine pale yellow. Take over-the-counter or prescription medicines. Eat foods that are high in fiber, such as beans, whole grains, and fresh fruits and vegetables. Limit foods that are high in fat and processed sugars, such as fried or sweet foods. Lifestyle Do not drink alcohol. Do not use any products that contain nicotine or tobacco, such as cigarettes, e-cigarettes, and chewing tobacco. If you need help quitting, ask your health care  provider. Get at least 8   hours of sleep every night. Find ways to manage stress, such as meditation, deep breathing, or yoga. General instructions   Keep a journal to find out what may trigger your migraine headaches. For example, write down: What you eat and drink. How much sleep you get. Any change to your diet or medicines. If you have a migraine headache: Avoid things that make your symptoms worse, such as bright lights. It may help to lie down in a dark, quiet room. Do not drive or use heavy machinery. Ask your health care provider what activities are safe for you while you are experiencing symptoms. Keep all follow-up visits as told by your health care provider. This is important. Contact a health care provider if: You develop symptoms that are different or more severe than your usual migraine headache symptoms. You have more than 15 headache days in one month. Get help right away if: Your migraine headache becomes severe. Your migraine headache lasts longer than 72 hours. You have a fever. You have a stiff neck. You have vision loss. Your muscles feel weak or like you cannot control them. You start to lose your balance often. You have trouble walking. You faint. You have a seizure. Summary A migraine headache is an intense, throbbing pain on one side or both sides of the head. Migraines may also cause other symptoms, such as nausea, vomiting, and sensitivity to light and noise. This condition may be treated with medicines and lifestyle changes. You may also need to avoid certain things that trigger a migraine headache. Keep a journal to find out what may trigger your migraine headaches. Contact your health care provider if you have more than 15 headache days in a month or you develop symptoms that are different or more severe than your usual migraine headache symptoms. This information is not intended to replace advice given to you by your health care provider. Make sure you  discuss any questions you have with your health care provider. Document Revised: 05/23/2018 Document Reviewed: 03/13/2018 Elsevier Patient Education  2022 [employer]  

## 2021-05-15 ENCOUNTER — Other Ambulatory Visit: Payer: Self-pay | Admitting: Family

## 2021-05-15 DIAGNOSIS — G8929 Other chronic pain: Secondary | ICD-10-CM

## 2021-05-30 ENCOUNTER — Ambulatory Visit (INDEPENDENT_AMBULATORY_CARE_PROVIDER_SITE_OTHER): Payer: 59 | Admitting: Family

## 2021-05-30 ENCOUNTER — Other Ambulatory Visit: Payer: Self-pay | Admitting: Family

## 2021-05-30 ENCOUNTER — Encounter: Payer: Self-pay | Admitting: Family

## 2021-05-30 VITALS — BP 173/107 | HR 91 | Temp 97.2°F | Ht 62.0 in | Wt 192.0 lb

## 2021-05-30 DIAGNOSIS — F411 Generalized anxiety disorder: Secondary | ICD-10-CM

## 2021-05-30 DIAGNOSIS — I10 Essential (primary) hypertension: Secondary | ICD-10-CM

## 2021-05-30 DIAGNOSIS — F321 Major depressive disorder, single episode, moderate: Secondary | ICD-10-CM

## 2021-05-30 DIAGNOSIS — G43109 Migraine with aura, not intractable, without status migrainosus: Secondary | ICD-10-CM | POA: Diagnosis not present

## 2021-05-30 MED ORDER — TOPIRAMATE 50 MG PO TABS: ORAL_TABLET | ORAL | 0 refills | Status: DC

## 2021-05-30 MED ORDER — TOPIRAMATE 100 MG PO TABS: 100.0000 mg | ORAL_TABLET | Freq: Two times a day (BID) | ORAL | 1 refills | Status: DC

## 2021-05-30 MED ORDER — AMLODIPINE BESYLATE 5 MG PO TABS
5.0000 mg | ORAL_TABLET | Freq: Every day | ORAL | 1 refills | Status: DC
Start: 2021-05-30 — End: 2021-05-31

## 2021-05-30 NOTE — Progress Notes (Signed)
? ?Subjective:  ? ? Patient ID: Monica Cross, female    DOB: 06-30-1955, 66 y.o.   MRN: [id number] ? ?Chief Complaint  ?Patient presents with  ? Follow-up  ? ?Pt presents to the office today to recheck migraines. She was seen on 04/14/21 and started on Topamax 50 mg BID. States she noticed her headaches have decreased, however, she ran out of her medications last week. States this morning she woke up with a headache.  ?Migraine  ?This is a chronic problem. The current episode started more than 1 year ago. The problem occurs intermittently. The problem has been gradually improving. The pain is located in the Right unilateral region. The pain quality is similar to prior headaches. The quality of the pain is described as sharp and throbbing. The pain is at a severity of 10/10. Associated symptoms include nausea and photophobia. Pertinent negatives include no phonophobia or vomiting. The symptoms are aggravated by emotional stress. She has tried beta blockers and antidepressants for the symptoms. The treatment provided moderate relief.  ?Depression ?       This is a chronic problem.  The current episode started more than 1 year ago.   Associated symptoms include no helplessness, no hopelessness and not irritable.     The symptoms are aggravated by family issues.  Past treatments include nothing.  Past medical history includes anxiety.   ?Anxiety ?Presents for follow-up visit. Symptoms include depressed mood, irritability, nausea and nervous/anxious behavior.  ? ? ? ? ? ?Review of Systems  ?Constitutional:  Positive for irritability.  ?Eyes:  Positive for photophobia.  ?Gastrointestinal:  Positive for nausea. Negative for vomiting.  ?Psychiatric/Behavioral:  Positive for depression. The patient is nervous/anxious.   ?All other systems reviewed and are negative. ? ?   ?Objective:  ? Physical Exam ?Vitals reviewed.  ?Constitutional:   ?   General: She is not irritable.She is not in acute distress. ?   Appearance: She is  well-developed. She is obese.  ?HENT:  ?   Head: Normocephalic and atraumatic.  ?   Right Ear: Tympanic membrane normal.  ?   Left Ear: Tympanic membrane normal.  ?Eyes:  ?   Pupils: Pupils are equal, round, and reactive to light.  ?Neck:  ?   Thyroid: No thyromegaly.  ?Cardiovascular:  ?   Rate and Rhythm: Normal rate and regular rhythm.  ?   Heart sounds: Normal heart sounds. No murmur heard. ?Pulmonary:  ?   Effort: Pulmonary effort is normal. No respiratory distress.  ?   Breath sounds: Normal breath sounds. No wheezing.  ?Abdominal:  ?   General: Bowel sounds are normal. There is no distension.  ?   Palpations: Abdomen is soft.  ?   Tenderness: There is no abdominal tenderness.  ?Musculoskeletal:     ?   General: No tenderness. Normal range of motion.  ?   Cervical back: Normal range of motion and neck supple.  ?Skin: ?   General: Skin is warm and dry.  ?Neurological:  ?   Mental Status: She is alert and oriented to person, place, and time.  ?   Cranial Nerves: No cranial nerve deficit.  ?   Deep Tendon Reflexes: Reflexes are normal and symmetric.  ?Psychiatric:     ?   Behavior: Behavior normal.     ?   Thought Content: Thought content normal.     ?   Judgment: Judgment normal.  ? ? ? ? ?BP (!) 180/100   Pulse 91  Temp (!) 97.2 ?F (36.2 ?C) (Temporal)   Ht '5\' 2"'$  (1.575 m)   Wt 192 lb (87.1 kg)   BMI 35.12 kg/m?  ? ?   ?Assessment & Plan:  ?Raylynn Hersh comes in today with chief complaint of Follow-up ? ? ?Diagnosis and orders addressed: ? ?1. Migraine with aura and without status migrainosus, not intractable ?Restart Topamax 50 mg BID then increased to 100 mg  ?Stress management  ?Limit caffeine  ?Headache journal  ?- topiramate (TOPAMAX) 50 MG tablet; Take 1 tablet (50 mg total) by mouth 2 (two) times daily for 7 days, THEN 2 tablets (100 mg total) 2 (two) times daily for 21 days.  Dispense: 98 tablet; Refill: 0 ?- topiramate (TOPAMAX) 100 MG tablet; Take 1 tablet (100 mg total) by mouth 2 (two) times  daily.  Dispense: 90 tablet; Refill: 1 ? ?2. Primary hypertension ?Add Norvasc 5 mg  ?- amLODipine (NORVASC) 5 MG tablet; Take 1 tablet (5 mg total) by mouth daily.  Dispense: 90 tablet; Refill: 1 ? ?3. Generalized anxiety disorder ? ?4. Depression, major, single episode, moderate ([place]) ? ?Labs pending ?Health Maintenance reviewed ?Diet and exercise encouraged ? ?Follow up plan: ?1 month ? ?Evelina Dun, FNP ? ? ? ?

## 2021-05-30 NOTE — Patient Instructions (Signed)
Migraine Headache A migraine headache is an intense, throbbing pain on one side or both sides of the head. Migraine headaches may also cause other symptoms, such as nausea, vomiting, and sensitivity to light and noise. A migraine headache can last from 4 hours to 3 days. Talk with your doctor about what things may bring on (trigger) your migraine headaches. What are the causes? The exact cause of this condition is not known. However, a migraine may be caused when nerves in the brain become irritated and release chemicals that cause inflammation of blood vessels. This inflammation causes pain. This condition may be triggered or caused by: Drinking alcohol. Smoking. Taking medicines, such as: Medicine used to treat chest pain (nitroglycerin). Birth control pills. Estrogen. Certain blood pressure medicines. Eating or drinking products that contain nitrates, glutamate, aspartame, or tyramine. Aged cheeses, chocolate, or caffeine may also be triggers. Doing physical activity. Other things that may trigger a migraine headache include: Menstruation. Pregnancy. Hunger. Stress. Lack of sleep or too much sleep. Weather changes. Fatigue. What increases the risk? The following factors may make you more likely to experience migraine headaches: Being a certain age. This condition is more common in people who are 25-55 years old. Being female. Having a family history of migraine headaches. Being Caucasian. Having a mental health condition, such as depression or anxiety. Being obese. What are the signs or symptoms? The main symptom of this condition is pulsating or throbbing pain. This pain may: Happen in any area of the head, such as on one side or both sides. Interfere with daily activities. Get worse with physical activity. Get worse with exposure to bright lights or loud noises. Other symptoms may include: Nausea. Vomiting. Dizziness. General sensitivity to bright lights, loud noises, or  smells. Before you get a migraine headache, you may get warning signs (an aura). An aura may include: Seeing flashing lights or having blind spots. Seeing bright spots, halos, or zigzag lines. Having tunnel vision or blurred vision. Having numbness or a tingling feeling. Having trouble talking. Having muscle weakness. Some people have symptoms after a migraine headache (postdromal phase), such as: Feeling tired. Difficulty concentrating. How is this diagnosed? A migraine headache can be diagnosed based on: Your symptoms. A physical exam. Tests, such as: CT scan or an MRI of the head. These imaging tests can help rule out other causes of headaches. Taking fluid from the spine (lumbar puncture) and analyzing it (cerebrospinal fluid analysis, or CSF analysis). How is this treated? This condition may be treated with medicines that: Relieve pain. Relieve nausea. Prevent migraine headaches. Treatment for this condition may also include: Acupuncture. Lifestyle changes like avoiding foods that trigger migraine headaches. Biofeedback. Cognitive behavioral therapy. Follow these instructions at home: Medicines Take over-the-counter and prescription medicines only as told by your health care provider. Ask your health care provider if the medicine prescribed to you: Requires you to avoid driving or using heavy machinery. Can cause constipation. You may need to take these actions to prevent or treat constipation: Drink enough fluid to keep your urine pale yellow. Take over-the-counter or prescription medicines. Eat foods that are high in fiber, such as beans, whole grains, and fresh fruits and vegetables. Limit foods that are high in fat and processed sugars, such as fried or sweet foods. Lifestyle Do not drink alcohol. Do not use any products that contain nicotine or tobacco, such as cigarettes, e-cigarettes, and chewing tobacco. If you need help quitting, ask your health care  provider. Get at least 8   hours of sleep every night. Find ways to manage stress, such as meditation, deep breathing, or yoga. General instructions     Keep a journal to find out what may trigger your migraine headaches. For example, write down: What you eat and drink. How much sleep you get. Any change to your diet or medicines. If you have a migraine headache: Avoid things that make your symptoms worse, such as bright lights. It may help to lie down in a dark, quiet room. Do not drive or use heavy machinery. Ask your health care provider what activities are safe for you while you are experiencing symptoms. Keep all follow-up visits as told by your health care provider. This is important. Contact a health care provider if: You develop symptoms that are different or more severe than your usual migraine headache symptoms. You have more than 15 headache days in one month. Get help right away if: Your migraine headache becomes severe. Your migraine headache lasts longer than 72 hours. You have a fever. You have a stiff neck. You have vision loss. Your muscles feel weak or like you cannot control them. You start to lose your balance often. You have trouble walking. You faint. You have a seizure. Summary A migraine headache is an intense, throbbing pain on one side or both sides of the head. Migraines may also cause other symptoms, such as nausea, vomiting, and sensitivity to light and noise. This condition may be treated with medicines and lifestyle changes. You may also need to avoid certain things that trigger a migraine headache. Keep a journal to find out what may trigger your migraine headaches. Contact your health care provider if you have more than 15 headache days in a month or you develop symptoms that are different or more severe than your usual migraine headache symptoms. This information is not intended to replace advice given to you by your health care provider. Make sure  you discuss any questions you have with your health care provider. Document Revised: 05/23/2018 Document Reviewed: 03/13/2018 Elsevier Patient Education  2023 [employer] Inc.  

## 2021-05-31 ENCOUNTER — Telehealth: Payer: Self-pay | Admitting: Family

## 2021-05-31 DIAGNOSIS — G43109 Migraine with aura, not intractable, without status migrainosus: Secondary | ICD-10-CM

## 2021-05-31 DIAGNOSIS — I10 Essential (primary) hypertension: Secondary | ICD-10-CM

## 2021-05-31 MED ORDER — AMLODIPINE BESYLATE 5 MG PO TABS: 5.0000 mg | ORAL_TABLET | Freq: Every day | ORAL | 1 refills | Status: DC

## 2021-05-31 MED ORDER — TOPIRAMATE 100 MG PO TABS: 100.0000 mg | ORAL_TABLET | Freq: Two times a day (BID) | ORAL | 1 refills | Status: DC

## 2021-05-31 MED ORDER — TOPIRAMATE 50 MG PO TABS: ORAL_TABLET | ORAL | 0 refills | Status: DC

## 2021-05-31 NOTE — Telephone Encounter (Signed)
Patient needs amLODipine (NORVASC) 5 MG tablet, topiramate (TOPAMAX) 100 MG tablet, and topiramate (TOPAMAX) 50 MG tabletto be sent to [facility] in [place] and canceled at [facility]  ?

## 2021-05-31 NOTE — Telephone Encounter (Signed)
Sent meds to [facility] and cancelled with mail order per patients request. Patient notified and notified understanding ?

## 2021-06-09 ENCOUNTER — Other Ambulatory Visit: Payer: Self-pay | Admitting: Family

## 2021-06-09 DIAGNOSIS — G8929 Other chronic pain: Secondary | ICD-10-CM

## 2021-06-14 ENCOUNTER — Other Ambulatory Visit: Payer: Self-pay | Admitting: Family

## 2021-06-14 DIAGNOSIS — I10 Essential (primary) hypertension: Secondary | ICD-10-CM

## 2021-06-29 ENCOUNTER — Ambulatory Visit (INDEPENDENT_AMBULATORY_CARE_PROVIDER_SITE_OTHER): Payer: Self-pay | Admitting: Family Medicine

## 2021-06-29 DIAGNOSIS — J40 Bronchitis, not specified as acute or chronic: Secondary | ICD-10-CM

## 2021-06-29 MED ORDER — LORATADINE 10 MG PO TABS: 10.0000 mg | ORAL_TABLET | Freq: Every day | ORAL | 0 refills | Status: DC

## 2021-06-29 MED ORDER — BENZONATATE 200 MG PO CAPS: 200.0000 mg | ORAL_CAPSULE | Freq: Two times a day (BID) | ORAL | 1 refills | Status: DC | PRN

## 2021-06-29 NOTE — Progress Notes (Signed)
Virtual Visit via telephone Note  I connected with Monica Cross on 06/29/21 at 1306 by telephone and verified that I am speaking with the correct person using two identifiers. Monica Cross is currently located at home and patient are currently with her during visit. The provider, Fransisca Kaufmann Juliet Vasbinder, MD is located in their office at time of visit.  Call ended at 1311  I discussed the limitations, risks, security and privacy concerns of performing an evaluation and management service by telephone and the availability of in person appointments. I also discussed with the patient that there may be a patient responsible charge related to this service. The patient expressed understanding and agreed to proceed.   History and Present Illness: Patient is calling in for cough and congestion for the past 2 weeks. She has used cough syrup and it is not helping. She denies SOB or wheezing or fevers or chills.  She was around granddaughter  last week who was ill but is better. She denies allergies from pollen.   1. Bronchitis     Outpatient Encounter Medications as of 06/29/2021  Medication Sig   benzonatate (TESSALON) 200 MG capsule Take 1 capsule (200 mg total) by mouth 2 (two) times daily as needed for cough.   loratadine (CLARITIN) 10 MG tablet Take 1 tablet (10 mg total) by mouth daily.   amLODipine (NORVASC) 5 MG tablet Take 1 tablet (5 mg total) by mouth daily.   atorvastatin (LIPITOR) 20 MG tablet Take 1 tablet (20 mg total) by mouth daily.   baclofen (LIORESAL) 10 MG tablet Take 1 tablet (10 mg total) by mouth 3 (three) times daily.   Choline Fenofibrate 135 MG capsule Take 1 capsule (135 mg total) by mouth daily.   Omega-3 1000 MG CAPS Take 1,000 mg by mouth daily.   SUMAtriptan (IMITREX) 50 MG tablet TAKE ONE TABLET BY MOUTH every TWO hours as needed FOR migraine - MAY REPEAT in TWO HOURS if headache persists OR recurs   telmisartan-hydrochlorothiazide (MICARDIS HCT) 80-25 MG tablet Take 1  tablet by mouth daily.   topiramate (TOPAMAX) 100 MG tablet Take 1 tablet (100 mg total) by mouth 2 (two) times daily.   topiramate (TOPAMAX) 50 MG tablet Take 1 tablet (50 mg total) by mouth 2 (two) times daily for 7 days, THEN 2 tablets (100 mg total) 2 (two) times daily for 21 days.   traMADol (ULTRAM) 50 MG tablet TAKE ONE TABLET BY MOUTH EVERY 8 HOURS AS NEEDED FOR 5 DAYS   traZODone (DESYREL) 50 MG tablet Take 1-2 tablets (50-100 mg total) by mouth at bedtime as needed for sleep.   No facility-administered encounter medications on file as of 06/29/2021.    Review of Systems  Constitutional:  Negative for chills and fever.  HENT:  Positive for congestion. Negative for ear discharge, ear pain, postnasal drip, rhinorrhea, sinus pressure, sneezing and sore throat.   Eyes:  Negative for pain, redness and visual disturbance.  Respiratory:  Positive for cough. Negative for chest tightness, shortness of breath and wheezing.   Cardiovascular:  Negative for chest pain and leg swelling.  Genitourinary:  Negative for difficulty urinating and dysuria.  Musculoskeletal:  Negative for back pain and gait problem.  Skin:  Negative for rash.  Neurological:  Negative for light-headedness and headaches.  Psychiatric/Behavioral:  Negative for agitation and behavioral problems.   All other systems reviewed and are negative.  Observations/Objective: She has cough but otherwise sounds comfortable and in no acute distress  Assessment  and Plan: Problem List Items Addressed This Visit   None Visit Diagnoses     Bronchitis    -  Primary   Relevant Medications   benzonatate (TESSALON) 200 MG capsule   loratadine (CLARITIN) 10 MG tablet       Will treat like probable viral versus allergic bronchitis, she has not tried a lot for it yet so we will try doing the antihistamine and Tessalon Perles and recommended Mucinex and to call us back if worsens or does not improve. Follow up plan: Return if symptoms  worsen or fail to improve.     I discussed the assessment and treatment plan with the patient. The patient was provided an opportunity to ask questions and all were answered. The patient agreed with the plan and demonstrated an understanding of the instructions.   The patient was advised to call back or seek an in-person evaluation if the symptoms worsen or if the condition fails to improve as anticipated.  The above assessment and management plan was discussed with the patient. The patient verbalized understanding of and has agreed to the management plan. Patient is aware to call the clinic if symptoms persist or worsen. Patient is aware when to return to the clinic for a follow-up visit. Patient educated on when it is appropriate to go to the emergency department.    I provided 5 minutes of non-face-to-face time during this encounter.    Worthy Rancher, MD

## 2021-06-30 ENCOUNTER — Ambulatory Visit: Payer: 59 | Admitting: Family

## 2021-07-12 ENCOUNTER — Other Ambulatory Visit: Payer: Self-pay | Admitting: Family Medicine

## 2021-07-12 DIAGNOSIS — J40 Bronchitis, not specified as acute or chronic: Secondary | ICD-10-CM

## 2021-07-13 ENCOUNTER — Other Ambulatory Visit: Payer: Self-pay | Admitting: Family

## 2021-07-13 DIAGNOSIS — G8929 Other chronic pain: Secondary | ICD-10-CM

## 2021-08-03 ENCOUNTER — Other Ambulatory Visit: Payer: Self-pay | Admitting: Family

## 2021-08-03 DIAGNOSIS — G4700 Insomnia, unspecified: Secondary | ICD-10-CM

## 2021-08-04 ENCOUNTER — Telehealth: Payer: Self-pay | Admitting: Family

## 2021-08-04 MED ORDER — ATORVASTATIN CALCIUM 20 MG PO TABS: 20.0000 mg | ORAL_TABLET | Freq: Every day | ORAL | 0 refills | Status: DC

## 2021-08-04 MED ORDER — CHOLINE FENOFIBRATE 135 MG PO CPDR: 135.0000 mg | DELAYED_RELEASE_CAPSULE | Freq: Every day | ORAL | 0 refills | Status: DC

## 2021-08-04 NOTE — Telephone Encounter (Signed)
  Prescription Request  08/04/2021  Is this a "Controlled Substance" medicine? YES  Have you seen your PCP in the last 2 weeks? YES  If YES, route message to pool  -  If NO, patient needs to be scheduled for appointment.  What is the name of the medication or equipment? Tramadol 50 mg, Atorvastatin 20 mg, Choline Fenofibrate 135 mg  Have you contacted your pharmacy to request a refill? YES    Which pharmacy would you like this sent to? [facility]   Patient notified that their request is being sent to the clinical staff for review and that they should receive a response within 2 business days.

## 2021-08-04 NOTE — Telephone Encounter (Signed)
Pt aware refills sent to pharmacy except the Tramadol.

## 2021-08-11 ENCOUNTER — Other Ambulatory Visit: Payer: Self-pay | Admitting: Family

## 2021-08-11 DIAGNOSIS — I10 Essential (primary) hypertension: Secondary | ICD-10-CM

## 2021-08-25 ENCOUNTER — Encounter: Payer: Self-pay | Admitting: Family

## 2021-08-25 ENCOUNTER — Ambulatory Visit (INDEPENDENT_AMBULATORY_CARE_PROVIDER_SITE_OTHER): Payer: Commercial Managed Care - HMO | Admitting: Family

## 2021-08-25 VITALS — BP 128/83 | HR 89 | Temp 98.1°F | Ht 62.0 in | Wt 194.0 lb

## 2021-08-25 DIAGNOSIS — G43109 Migraine with aura, not intractable, without status migrainosus: Secondary | ICD-10-CM | POA: Diagnosis not present

## 2021-08-25 DIAGNOSIS — K5900 Constipation, unspecified: Secondary | ICD-10-CM

## 2021-08-25 DIAGNOSIS — Z79899 Other long term (current) drug therapy: Secondary | ICD-10-CM

## 2021-08-25 DIAGNOSIS — F411 Generalized anxiety disorder: Secondary | ICD-10-CM

## 2021-08-25 DIAGNOSIS — I10 Essential (primary) hypertension: Secondary | ICD-10-CM | POA: Diagnosis not present

## 2021-08-25 DIAGNOSIS — F321 Major depressive disorder, single episode, moderate: Secondary | ICD-10-CM

## 2021-08-25 DIAGNOSIS — G4700 Insomnia, unspecified: Secondary | ICD-10-CM

## 2021-08-25 DIAGNOSIS — G8929 Other chronic pain: Secondary | ICD-10-CM

## 2021-08-25 DIAGNOSIS — M542 Cervicalgia: Secondary | ICD-10-CM

## 2021-08-25 DIAGNOSIS — M170 Bilateral primary osteoarthritis of knee: Secondary | ICD-10-CM

## 2021-08-25 DIAGNOSIS — E7800 Pure hypercholesterolemia, unspecified: Secondary | ICD-10-CM

## 2021-08-25 MED ORDER — TRAZODONE HCL 50 MG PO TABS: 50.0000 mg | ORAL_TABLET | Freq: Every evening | ORAL | 1 refills | Status: DC | PRN

## 2021-08-25 MED ORDER — TRAMADOL HCL 50 MG PO TABS: 50.0000 mg | ORAL_TABLET | Freq: Two times a day (BID) | ORAL | 2 refills | Status: DC | PRN
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
## 2021-08-25 NOTE — Patient Instructions (Signed)
Insomnia Insomnia is a sleep disorder that makes it difficult to fall asleep or stay asleep. Insomnia can cause fatigue, low energy, difficulty concentrating, mood swings, and poor performance at work or school. There are three different ways to classify insomnia: Difficulty falling asleep. Difficulty staying asleep. Waking up too early in the morning. Any type of insomnia can be long-term (chronic) or short-term (acute). Both are common. Short-term insomnia usually lasts for 3 months or less. Chronic insomnia occurs at least three times a week for longer than 3 months. What are the causes? Insomnia may be caused by another condition, situation, or substance, such as: Having certain mental health conditions, such as anxiety and depression. Using caffeine, alcohol, tobacco, or drugs. Having gastrointestinal conditions, such as gastroesophageal reflux disease (GERD). Having certain medical conditions. These include: Asthma. Alzheimer's disease. Stroke. Chronic pain. An overactive thyroid gland (hyperthyroidism). Other sleep disorders, such as restless legs syndrome and sleep apnea. Menopause. Sometimes, the cause of insomnia may not be known. What increases the risk? Risk factors for insomnia include: Gender. Females are affected more often than males. Age. Insomnia is more common as people get older. Stress and certain medical and mental health conditions. Lack of exercise. Having an irregular work schedule. This may include working night shifts and traveling between different time zones. What are the signs or symptoms? If you have insomnia, the main symptom is having trouble falling asleep or having trouble staying asleep. This may lead to other symptoms, such as: Feeling tired or having low energy. Feeling nervous about going to sleep. Not feeling rested in the morning. Having trouble concentrating. Feeling irritable, anxious, or depressed. How is this diagnosed? This condition  may be diagnosed based on: Your symptoms and medical history. Your health care provider may ask about: Your sleep habits. Any medical conditions you have. Your mental health. A physical exam. How is this treated? Treatment for insomnia depends on the cause. Treatment may focus on treating an underlying condition that is causing the insomnia. Treatment may also include: Medicines to help you sleep. Counseling or therapy. Lifestyle adjustments to help you sleep better. Follow these instructions at home: Eating and drinking  Limit or avoid alcohol, caffeinated beverages, and products that contain nicotine and tobacco, especially close to bedtime. These can disrupt your sleep. Do not eat a large meal or eat spicy foods right before bedtime. This can lead to digestive discomfort that can make it hard for you to sleep. Sleep habits  Keep a sleep diary to help you and your health care provider figure out what could be causing your insomnia. Write down: When you sleep. When you wake up during the night. How well you sleep and how rested you feel the next day. Any side effects of medicines you are taking. What you eat and drink. Make your bedroom a dark, comfortable place where it is easy to fall asleep. Put up shades or blackout curtains to block light from outside. Use a white noise machine to block noise. Keep the temperature cool. Limit screen use before bedtime. This includes: Not watching TV. Not using your smartphone, tablet, or computer. Stick to a routine that includes going to bed and waking up at the same times every day and night. This can help you fall asleep faster. Consider making a quiet activity, such as reading, part of your nighttime routine. Try to avoid taking naps during the day so that you sleep better at night. Get out of bed if you are still awake after   15 minutes of trying to sleep. Keep the lights down, but try reading or doing a quiet activity. When you feel  sleepy, go back to bed. General instructions Take over-the-counter and prescription medicines only as told by your health care provider. Exercise regularly as told by your health care provider. However, avoid exercising in the hours right before bedtime. Use relaxation techniques to manage stress. Ask your health care provider to suggest some techniques that may work well for you. These may include: Breathing exercises. Routines to release muscle tension. Visualizing peaceful scenes. Make sure that you drive carefully. Do not drive if you feel very sleepy. Keep all follow-up visits. This is important. Contact a health care provider if: You are tired throughout the day. You have trouble in your daily routine due to sleepiness. You continue to have sleep problems, or your sleep problems get worse. Get help right away if: You have thoughts about hurting yourself or someone else. Get help right away if you feel like you may hurt yourself or others, or have thoughts about taking your own life. Go to your nearest emergency room or: Call 911. Call the National Suicide Prevention Lifeline at 1-800-273-8255 or 988. This is open 24 hours a day. Text the Crisis Text Line at 741741. Summary Insomnia is a sleep disorder that makes it difficult to fall asleep or stay asleep. Insomnia can be long-term (chronic) or short-term (acute). Treatment for insomnia depends on the cause. Treatment may focus on treating an underlying condition that is causing the insomnia. Keep a sleep diary to help you and your health care provider figure out what could be causing your insomnia. This information is not intended to replace advice given to you by your health care provider. Make sure you discuss any questions you have with your health care provider. Document Revised: 01/09/2021 Document Reviewed: 01/09/2021 Elsevier Patient Education  2023 [employer] Inc.  

## 2021-08-28 ENCOUNTER — Other Ambulatory Visit: Payer: Self-pay | Admitting: *Deleted

## 2021-08-28 DIAGNOSIS — I10 Essential (primary) hypertension: Secondary | ICD-10-CM

## 2021-08-28 MED ORDER — AMLODIPINE BESYLATE 5 MG PO TABS: 5.0000 mg | ORAL_TABLET | Freq: Every day | ORAL | 1 refills | Status: DC

## 2021-08-30 LAB — TOXASSURE SELECT 13 (MW), URINE

## 2021-09-04 ENCOUNTER — Other Ambulatory Visit: Payer: Self-pay | Admitting: Family

## 2021-09-04 DIAGNOSIS — R519 Headache, unspecified: Secondary | ICD-10-CM

## 2021-09-08 ENCOUNTER — Other Ambulatory Visit: Payer: Self-pay | Admitting: Family

## 2021-09-08 DIAGNOSIS — I10 Essential (primary) hypertension: Secondary | ICD-10-CM

## 2021-09-26 ENCOUNTER — Other Ambulatory Visit: Payer: Self-pay | Admitting: Family

## 2021-09-26 DIAGNOSIS — G4700 Insomnia, unspecified: Secondary | ICD-10-CM

## 2021-09-26 NOTE — Telephone Encounter (Signed)
Last office visit 08/25/21 Last refill 08/25/21, #60, 1 refill

## 2021-10-03 ENCOUNTER — Other Ambulatory Visit: Payer: Self-pay | Admitting: Family

## 2021-10-03 ENCOUNTER — Telehealth: Payer: Self-pay

## 2021-10-03 DIAGNOSIS — G8929 Other chronic pain: Secondary | ICD-10-CM

## 2021-10-03 NOTE — Telephone Encounter (Signed)
Yasenia Roller Key: [id number] - PA Case ID: [id number] - Rx #: [id number] help? Call us at [phone] Outcome Approvedon August 15 Your PA request has been approved. Additional information will be provided in the approval communication. (Message 1145) Drug traMADol HCl '50MG'$  tablets Form [employer] Electronic PA Form [phone] NCPDP) Original Claim Info 75 MAX 7 DS PERREDUCE D/S OR PA REQ CALL [phone] REQUIRES PRIOR AUTHORIZATION(PHARMACY HELP DESK [phone]) For RxLocal Coupon Price of: $21.48 submit to BIN: [id number] PCN: CP Group: [id number] --Service provided at no cost and no switch fee to the pharmacy-

## 2021-10-25 ENCOUNTER — Other Ambulatory Visit: Payer: Self-pay | Admitting: Family

## 2021-10-25 DIAGNOSIS — I10 Essential (primary) hypertension: Secondary | ICD-10-CM

## 2021-10-31 ENCOUNTER — Other Ambulatory Visit: Payer: Self-pay | Admitting: Family

## 2021-11-08 ENCOUNTER — Other Ambulatory Visit: Payer: Self-pay | Admitting: Family

## 2021-11-08 DIAGNOSIS — G43109 Migraine with aura, not intractable, without status migrainosus: Secondary | ICD-10-CM

## 2021-11-08 DIAGNOSIS — I10 Essential (primary) hypertension: Secondary | ICD-10-CM

## 2021-11-27 ENCOUNTER — Ambulatory Visit: Payer: Commercial Managed Care - HMO | Admitting: Family

## 2021-11-30 ENCOUNTER — Ambulatory Visit (INDEPENDENT_AMBULATORY_CARE_PROVIDER_SITE_OTHER): Payer: 59 | Admitting: Family

## 2021-11-30 ENCOUNTER — Encounter: Payer: Self-pay | Admitting: Family

## 2021-11-30 VITALS — BP 120/74 | HR 77 | Temp 97.7°F | Ht 62.0 in | Wt 187.8 lb

## 2021-11-30 DIAGNOSIS — M170 Bilateral primary osteoarthritis of knee: Secondary | ICD-10-CM | POA: Diagnosis not present

## 2021-11-30 DIAGNOSIS — Z23 Encounter for immunization: Secondary | ICD-10-CM

## 2021-11-30 DIAGNOSIS — Z0001 Encounter for general adult medical examination with abnormal findings: Secondary | ICD-10-CM

## 2021-11-30 DIAGNOSIS — F411 Generalized anxiety disorder: Secondary | ICD-10-CM

## 2021-11-30 DIAGNOSIS — M542 Cervicalgia: Secondary | ICD-10-CM

## 2021-11-30 DIAGNOSIS — E7800 Pure hypercholesterolemia, unspecified: Secondary | ICD-10-CM

## 2021-11-30 DIAGNOSIS — G4700 Insomnia, unspecified: Secondary | ICD-10-CM

## 2021-11-30 DIAGNOSIS — G8929 Other chronic pain: Secondary | ICD-10-CM

## 2021-11-30 DIAGNOSIS — R69 Illness, unspecified: Secondary | ICD-10-CM | POA: Diagnosis not present

## 2021-11-30 DIAGNOSIS — K5900 Constipation, unspecified: Secondary | ICD-10-CM

## 2021-11-30 DIAGNOSIS — Z0000 Encounter for general adult medical examination without abnormal findings: Secondary | ICD-10-CM

## 2021-11-30 DIAGNOSIS — F321 Major depressive disorder, single episode, moderate: Secondary | ICD-10-CM

## 2021-11-30 DIAGNOSIS — L910 Hypertrophic scar: Secondary | ICD-10-CM

## 2021-11-30 DIAGNOSIS — R519 Headache, unspecified: Secondary | ICD-10-CM | POA: Diagnosis not present

## 2021-11-30 DIAGNOSIS — Z79899 Other long term (current) drug therapy: Secondary | ICD-10-CM

## 2021-11-30 DIAGNOSIS — I10 Essential (primary) hypertension: Secondary | ICD-10-CM

## 2021-11-30 DIAGNOSIS — G43109 Migraine with aura, not intractable, without status migrainosus: Secondary | ICD-10-CM | POA: Diagnosis not present

## 2021-11-30 MED ORDER — FENOFIBRIC ACID 135 MG PO CPDR: DELAYED_RELEASE_CAPSULE | ORAL | 1 refills | Status: DC

## 2021-11-30 MED ORDER — TOPIRAMATE 100 MG PO TABS: 100.0000 mg | ORAL_TABLET | Freq: Two times a day (BID) | ORAL | 0 refills | Status: DC

## 2021-11-30 MED ORDER — TRAZODONE HCL 100 MG PO TABS: 100.0000 mg | ORAL_TABLET | Freq: Every day | ORAL | 1 refills | Status: DC

## 2021-11-30 MED ORDER — TRAZODONE HCL 50 MG PO TABS: ORAL_TABLET | ORAL | 1 refills | Status: DC

## 2021-11-30 MED ORDER — TRAMADOL HCL 50 MG PO TABS: 50.0000 mg | ORAL_TABLET | Freq: Two times a day (BID) | ORAL | 2 refills | Status: DC | PRN

## 2021-11-30 MED ORDER — SUMATRIPTAN SUCCINATE 50 MG PO TABS: ORAL_TABLET | ORAL | 2 refills | Status: DC

## 2021-11-30 MED ORDER — AMLODIPINE BESYLATE 5 MG PO TABS: 5.0000 mg | ORAL_TABLET | Freq: Every day | ORAL | 0 refills | Status: DC

## 2021-11-30 MED ORDER — ATORVASTATIN CALCIUM 20 MG PO TABS: 20.0000 mg | ORAL_TABLET | Freq: Every day | ORAL | 0 refills | Status: DC

## 2021-11-30 MED ORDER — TELMISARTAN-HCTZ 80-25 MG PO TABS: 1.0000 | ORAL_TABLET | Freq: Every day | ORAL | 2 refills | Status: DC

## 2021-11-30 MED ORDER — BACLOFEN 10 MG PO TABS: 10.0000 mg | ORAL_TABLET | Freq: Three times a day (TID) | ORAL | 2 refills | Status: DC
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
## 2021-11-30 NOTE — Patient Instructions (Signed)
Insomnia Insomnia is a sleep disorder that makes it difficult to fall asleep or stay asleep. Insomnia can cause fatigue, low energy, difficulty concentrating, mood swings, and poor performance at work or school. There are three different ways to classify insomnia: Difficulty falling asleep. Difficulty staying asleep. Waking up too early in the morning. Any type of insomnia can be long-term (chronic) or short-term (acute). Both are common. Short-term insomnia usually lasts for 3 months or less. Chronic insomnia occurs at least three times a week for longer than 3 months. What are the causes? Insomnia may be caused by another condition, situation, or substance, such as: Having certain mental health conditions, such as anxiety and depression. Using caffeine, alcohol, tobacco, or drugs. Having gastrointestinal conditions, such as gastroesophageal reflux disease (GERD). Having certain medical conditions. These include: Asthma. Alzheimer's disease. Stroke. Chronic pain. An overactive thyroid gland (hyperthyroidism). Other sleep disorders, such as restless legs syndrome and sleep apnea. Menopause. Sometimes, the cause of insomnia may not be known. What increases the risk? Risk factors for insomnia include: Gender. Females are affected more often than males. Age. Insomnia is more common as people get older. Stress and certain medical and mental health conditions. Lack of exercise. Having an irregular work schedule. This may include working night shifts and traveling between different time zones. What are the signs or symptoms? If you have insomnia, the main symptom is having trouble falling asleep or having trouble staying asleep. This may lead to other symptoms, such as: Feeling tired or having low energy. Feeling nervous about going to sleep. Not feeling rested in the morning. Having trouble concentrating. Feeling irritable, anxious, or depressed. How is this diagnosed? This condition  may be diagnosed based on: Your symptoms and medical history. Your health care provider may ask about: Your sleep habits. Any medical conditions you have. Your mental health. A physical exam. How is this treated? Treatment for insomnia depends on the cause. Treatment may focus on treating an underlying condition that is causing the insomnia. Treatment may also include: Medicines to help you sleep. Counseling or therapy. Lifestyle adjustments to help you sleep better. Follow these instructions at home: Eating and drinking  Limit or avoid alcohol, caffeinated beverages, and products that contain nicotine and tobacco, especially close to bedtime. These can disrupt your sleep. Do not eat a large meal or eat spicy foods right before bedtime. This can lead to digestive discomfort that can make it hard for you to sleep. Sleep habits  Keep a sleep diary to help you and your health care provider figure out what could be causing your insomnia. Write down: When you sleep. When you wake up during the night. How well you sleep and how rested you feel the next day. Any side effects of medicines you are taking. What you eat and drink. Make your bedroom a dark, comfortable place where it is easy to fall asleep. Put up shades or blackout curtains to block light from outside. Use a white noise machine to block noise. Keep the temperature cool. Limit screen use before bedtime. This includes: Not watching TV. Not using your smartphone, tablet, or computer. Stick to a routine that includes going to bed and waking up at the same times every day and night. This can help you fall asleep faster. Consider making a quiet activity, such as reading, part of your nighttime routine. Try to avoid taking naps during the day so that you sleep better at night. Get out of bed if you are still awake after   15 minutes of trying to sleep. Keep the lights down, but try reading or doing a quiet activity. When you feel  sleepy, go back to bed. General instructions Take over-the-counter and prescription medicines only as told by your health care provider. Exercise regularly as told by your health care provider. However, avoid exercising in the hours right before bedtime. Use relaxation techniques to manage stress. Ask your health care provider to suggest some techniques that may work well for you. These may include: Breathing exercises. Routines to release muscle tension. Visualizing peaceful scenes. Make sure that you drive carefully. Do not drive if you feel very sleepy. Keep all follow-up visits. This is important. Contact a health care provider if: You are tired throughout the day. You have trouble in your daily routine due to sleepiness. You continue to have sleep problems, or your sleep problems get worse. Get help right away if: You have thoughts about hurting yourself or someone else. Get help right away if you feel like you may hurt yourself or others, or have thoughts about taking your own life. Go to your nearest emergency room or: Call 911. Call the National Suicide Prevention Lifeline at 1-800-273-8255 or 988. This is open 24 hours a day. Text the Crisis Text Line at 741741. Summary Insomnia is a sleep disorder that makes it difficult to fall asleep or stay asleep. Insomnia can be long-term (chronic) or short-term (acute). Treatment for insomnia depends on the cause. Treatment may focus on treating an underlying condition that is causing the insomnia. Keep a sleep diary to help you and your health care provider figure out what could be causing your insomnia. This information is not intended to replace advice given to you by your health care provider. Make sure you discuss any questions you have with your health care provider. Document Revised: 01/09/2021 Document Reviewed: 01/09/2021 Elsevier Patient Education  2023 [employer] Inc.  

## 2021-12-01 LAB — CMP14+EGFR
ALT: 19 IU/L (ref 0–32)
AST: 19 IU/L (ref 0–40)
Albumin/Globulin Ratio: 1.6 (ref 1.2–2.2)
Albumin: 4.2 g/dL (ref 3.9–4.9)
Alkaline Phosphatase: 55 IU/L (ref 44–121)
BUN/Creatinine Ratio: 21 (ref 12–28)
BUN: 18 mg/dL (ref 8–27)
Bilirubin Total: 0.3 mg/dL (ref 0.0–1.2)
CO2: 26 mmol/L (ref 20–29)
Calcium: 10.3 mg/dL (ref 8.7–10.3)
Chloride: 101 mmol/L (ref 96–106)
Creatinine, Ser: 0.87 mg/dL (ref 0.57–1.00)
Globulin, Total: 2.6 g/dL (ref 1.5–4.5)
Glucose: 102 mg/dL — ABNORMAL HIGH (ref 70–99)
Potassium: 4.3 mmol/L (ref 3.5–5.2)
Sodium: 140 mmol/L (ref 134–144)
Total Protein: 6.8 g/dL (ref 6.0–8.5)
eGFR: 75 mL/min/{1.73_m2} (ref >59)

## 2021-12-01 LAB — CBC WITH DIFFERENTIAL/PLATELET
Basophils Absolute: 0.1 10*3/uL (ref 0.0–0.2)
Basos: 1 %
EOS (ABSOLUTE): 0.1 10*3/uL (ref 0.0–0.4)
Eos: 2 %
Hematocrit: 40.1 % (ref 34.0–46.6)
Hemoglobin: 13.3 g/dL (ref 11.1–15.9)
Immature Grans (Abs): 0 10*3/uL (ref 0.0–0.1)
Immature Granulocytes: 0 %
Lymphocytes Absolute: 2.2 10*3/uL (ref 0.7–3.1)
Lymphs: 35 %
MCH: 31.4 pg (ref 26.6–33.0)
MCHC: 33.2 g/dL (ref 31.5–35.7)
MCV: 95 fL (ref 79–97)
Monocytes Absolute: 0.4 10*3/uL (ref 0.1–0.9)
Monocytes: 6 %
Neutrophils Absolute: 3.5 10*3/uL (ref 1.4–7.0)
Neutrophils: 56 %
Platelets: 447 10*3/uL (ref 150–450)
RBC: 4.23 x10E6/uL (ref 3.77–5.28)
RDW: 13.1 % (ref 11.7–15.4)
WBC: 6.2 10*3/uL (ref 3.4–10.8)

## 2021-12-01 LAB — LIPID PANEL
Chol/HDL Ratio: 3.8 ratio (ref 0.0–4.4)
Cholesterol, Total: 174 mg/dL (ref 100–199)
HDL: 46 mg/dL (ref >39)
LDL Chol Calc (NIH): 104 mg/dL — ABNORMAL HIGH (ref 0–99)
Triglycerides: 132 mg/dL (ref 0–149)
VLDL Cholesterol Cal: 24 mg/dL (ref 5–40)

## 2021-12-01 LAB — TSH: TSH: 1.85 u[IU]/mL (ref 0.450–4.500)

## 2022-02-03 ENCOUNTER — Other Ambulatory Visit: Payer: Self-pay | Admitting: Family

## 2022-02-03 DIAGNOSIS — G8929 Other chronic pain: Secondary | ICD-10-CM

## 2022-02-03 DIAGNOSIS — Z79899 Other long term (current) drug therapy: Secondary | ICD-10-CM

## 2022-03-05 ENCOUNTER — Ambulatory Visit: Payer: 59 | Admitting: Family

## 2022-03-15 ENCOUNTER — Other Ambulatory Visit: Payer: Self-pay | Admitting: Family

## 2022-03-15 DIAGNOSIS — Z79899 Other long term (current) drug therapy: Secondary | ICD-10-CM

## 2022-03-15 DIAGNOSIS — G8929 Other chronic pain: Secondary | ICD-10-CM

## 2022-03-15 DIAGNOSIS — E7800 Pure hypercholesterolemia, unspecified: Secondary | ICD-10-CM

## 2022-03-27 ENCOUNTER — Ambulatory Visit (INDEPENDENT_AMBULATORY_CARE_PROVIDER_SITE_OTHER): Payer: 59 | Admitting: Family

## 2022-03-27 ENCOUNTER — Encounter: Payer: Self-pay | Admitting: Family

## 2022-03-27 VITALS — BP 112/69 | HR 80 | Temp 96.9°F | Ht 62.0 in | Wt 182.0 lb

## 2022-03-27 DIAGNOSIS — K5900 Constipation, unspecified: Secondary | ICD-10-CM | POA: Diagnosis not present

## 2022-03-27 DIAGNOSIS — F321 Major depressive disorder, single episode, moderate: Secondary | ICD-10-CM | POA: Diagnosis not present

## 2022-03-27 DIAGNOSIS — G4700 Insomnia, unspecified: Secondary | ICD-10-CM

## 2022-03-27 DIAGNOSIS — G43109 Migraine with aura, not intractable, without status migrainosus: Secondary | ICD-10-CM

## 2022-03-27 DIAGNOSIS — I10 Essential (primary) hypertension: Secondary | ICD-10-CM | POA: Diagnosis not present

## 2022-03-27 DIAGNOSIS — M542 Cervicalgia: Secondary | ICD-10-CM

## 2022-03-27 DIAGNOSIS — M170 Bilateral primary osteoarthritis of knee: Secondary | ICD-10-CM

## 2022-03-27 DIAGNOSIS — E7800 Pure hypercholesterolemia, unspecified: Secondary | ICD-10-CM | POA: Diagnosis not present

## 2022-03-27 DIAGNOSIS — Z79899 Other long term (current) drug therapy: Secondary | ICD-10-CM

## 2022-03-27 DIAGNOSIS — F411 Generalized anxiety disorder: Secondary | ICD-10-CM

## 2022-03-27 DIAGNOSIS — G8929 Other chronic pain: Secondary | ICD-10-CM

## 2022-03-27 MED ORDER — TELMISARTAN-HCTZ 80-25 MG PO TABS: 1.0000 | ORAL_TABLET | Freq: Every day | ORAL | 2 refills | Status: DC

## 2022-03-27 MED ORDER — ESCITALOPRAM OXALATE 10 MG PO TABS: 10.0000 mg | ORAL_TABLET | Freq: Every day | ORAL | 3 refills | Status: DC

## 2022-03-27 MED ORDER — AMLODIPINE BESYLATE 5 MG PO TABS: 5.0000 mg | ORAL_TABLET | Freq: Every day | ORAL | 0 refills | Status: DC

## 2022-03-27 MED ORDER — TRAMADOL HCL 50 MG PO TABS: 50.0000 mg | ORAL_TABLET | Freq: Two times a day (BID) | ORAL | 2 refills | Status: DC | PRN
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
## 2022-03-27 NOTE — Patient Instructions (Signed)

## 2022-05-14 DIAGNOSIS — Z419 Encounter for procedure for purposes other than remedying health state, unspecified: Secondary | ICD-10-CM | POA: Diagnosis not present

## 2022-05-23 ENCOUNTER — Other Ambulatory Visit: Payer: Self-pay | Admitting: Family

## 2022-05-23 DIAGNOSIS — G8929 Other chronic pain: Secondary | ICD-10-CM

## 2022-05-23 DIAGNOSIS — Z79899 Other long term (current) drug therapy: Secondary | ICD-10-CM

## 2022-06-14 ENCOUNTER — Telehealth: Payer: Self-pay

## 2022-06-14 NOTE — Telephone Encounter (Signed)
Monica Cross (Key: [id number]) PA Case ID #: [id number] Rx #: [id number] Need Help? Call us at [phone] Status sent iconSent to Plan today Drug Fenofibric Acid 135MG  dr capsules ePA cloud logo Form [facility] Medicaid of [facility] Electronic Prior Authorization Request Form [phone] NCPDP) Original Claim Info 75 Submit 3 DS For Emerg Fill with PA Type01, PA Number 1111, Level of Service 3 For RxLocal Coupon Price of: $38.37 submit to BIN: [id number] PCN: CP Group: COUPON --Service provided at no cost and no switch fee to the pharmacy--

## 2022-06-15 NOTE — Telephone Encounter (Signed)
Fenfofibric acid denied.  At least two preferred drugs must be tried before requesting this drug.  Preferred drugs are:  Lorana Dovel (Key: [id number]) PA Case ID #: [id number] Rx #: [id number] Need Help? Call us at [phone] Outcome Denied on May 2 Denied. Per the health plan's preferred drug list, at least 2 preferred drugs must be tried before requesting this drug or tell us why the member cannot try any preferred alternatives. Please send [place] supporting chart notes and lab results. Here is list of preferred alternatives: gemfibrozil tablet (generic for Lopid), omega-3 acid ethyl esters capsule (generic for Lovaza), Vascepa Capsule. Per our records, the member has already tried fenofibrate tablet (generic for Tricor). Note: Some preferred drug(s) may have quantity limits. Refer to the health plan's preferred drug list for additional details. Drug Fenofibric Acid 135MG  dr capsules ePA cloud logo Form [facility] Medicaid of [facility] Electronic Prior Authorization Request Form [phone] NCPDP)

## 2022-06-25 ENCOUNTER — Ambulatory Visit (INDEPENDENT_AMBULATORY_CARE_PROVIDER_SITE_OTHER): Payer: Medicaid Other | Admitting: Family

## 2022-06-25 ENCOUNTER — Encounter: Payer: Self-pay | Admitting: Family

## 2022-06-25 VITALS — BP 108/73 | HR 70 | Temp 97.1°F | Ht 62.0 in | Wt 184.4 lb

## 2022-06-25 DIAGNOSIS — L910 Hypertrophic scar: Secondary | ICD-10-CM | POA: Diagnosis not present

## 2022-06-25 DIAGNOSIS — I10 Essential (primary) hypertension: Secondary | ICD-10-CM

## 2022-06-25 DIAGNOSIS — M51369 Other intervertebral disc degeneration, lumbar region without mention of lumbar back pain or lower extremity pain: Secondary | ICD-10-CM

## 2022-06-25 DIAGNOSIS — R519 Headache, unspecified: Secondary | ICD-10-CM | POA: Diagnosis not present

## 2022-06-25 DIAGNOSIS — M542 Cervicalgia: Secondary | ICD-10-CM | POA: Diagnosis not present

## 2022-06-25 DIAGNOSIS — G8929 Other chronic pain: Secondary | ICD-10-CM | POA: Diagnosis not present

## 2022-06-25 DIAGNOSIS — K5900 Constipation, unspecified: Secondary | ICD-10-CM

## 2022-06-25 DIAGNOSIS — F321 Major depressive disorder, single episode, moderate: Secondary | ICD-10-CM

## 2022-06-25 DIAGNOSIS — Z79899 Other long term (current) drug therapy: Secondary | ICD-10-CM

## 2022-06-25 DIAGNOSIS — F411 Generalized anxiety disorder: Secondary | ICD-10-CM | POA: Diagnosis not present

## 2022-06-25 DIAGNOSIS — G43109 Migraine with aura, not intractable, without status migrainosus: Secondary | ICD-10-CM

## 2022-06-25 DIAGNOSIS — M170 Bilateral primary osteoarthritis of knee: Secondary | ICD-10-CM

## 2022-06-25 DIAGNOSIS — E7800 Pure hypercholesterolemia, unspecified: Secondary | ICD-10-CM

## 2022-06-25 DIAGNOSIS — M5136 Other intervertebral disc degeneration, lumbar region: Secondary | ICD-10-CM

## 2022-06-25 DIAGNOSIS — G4700 Insomnia, unspecified: Secondary | ICD-10-CM

## 2022-06-25 MED ORDER — TOPIRAMATE 100 MG PO TABS
100.0000 mg | ORAL_TABLET | Freq: Two times a day (BID) | ORAL | 0 refills | Status: DC
Start: 2022-06-25 — End: 2022-08-22

## 2022-06-25 MED ORDER — TELMISARTAN-HCTZ 80-25 MG PO TABS
1.0000 | ORAL_TABLET | Freq: Every day | ORAL | 2 refills | Status: DC
Start: 2022-06-25 — End: 2022-08-03

## 2022-06-25 MED ORDER — DESVENLAFAXINE SUCCINATE ER 50 MG PO TB24
50.0000 mg | ORAL_TABLET | Freq: Every day | ORAL | 1 refills | Status: DC
Start: 2022-06-25 — End: 2022-09-21

## 2022-06-25 MED ORDER — ATORVASTATIN CALCIUM 20 MG PO TABS: 20.0000 mg | ORAL_TABLET | Freq: Every day | ORAL | 1 refills | Status: DC

## 2022-06-25 MED ORDER — TRAMADOL HCL 50 MG PO TABS
50.0000 mg | ORAL_TABLET | Freq: Two times a day (BID) | ORAL | 2 refills | Status: DC | PRN
Start: 2022-06-25 — End: 2022-09-25

## 2022-06-25 MED ORDER — SUMATRIPTAN SUCCINATE 50 MG PO TABS: ORAL_TABLET | ORAL | 2 refills | Status: DC

## 2022-06-25 MED ORDER — FENOFIBRIC ACID 135 MG PO CPDR: DELAYED_RELEASE_CAPSULE | ORAL | 1 refills | Status: DC

## 2022-06-25 MED ORDER — BACLOFEN 10 MG PO TABS: 10.0000 mg | ORAL_TABLET | Freq: Three times a day (TID) | ORAL | 2 refills | Status: DC

## 2022-06-25 MED ORDER — AMLODIPINE BESYLATE 5 MG PO TABS: 5.0000 mg | ORAL_TABLET | Freq: Every day | ORAL | 1 refills | Status: DC
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
## 2022-06-25 NOTE — Patient Instructions (Signed)

## 2022-06-26 LAB — CMP14+EGFR
ALT: 34 IU/L — ABNORMAL HIGH (ref 0–32)
AST: 23 IU/L (ref 0–40)
Albumin/Globulin Ratio: 1.5 (ref 1.2–2.2)
Albumin: 4.1 g/dL (ref 3.9–4.9)
Alkaline Phosphatase: 74 IU/L (ref 44–121)
BUN/Creatinine Ratio: 26 (ref 12–28)
BUN: 22 mg/dL (ref 8–27)
Bilirubin Total: 0.3 mg/dL (ref 0.0–1.2)
CO2: 23 mmol/L (ref 20–29)
Calcium: 9.8 mg/dL (ref 8.7–10.3)
Chloride: 100 mmol/L (ref 96–106)
Creatinine, Ser: 0.85 mg/dL (ref 0.57–1.00)
Globulin, Total: 2.7 g/dL (ref 1.5–4.5)
Glucose: 84 mg/dL (ref 70–99)
Potassium: 4.5 mmol/L (ref 3.5–5.2)
Sodium: 137 mmol/L (ref 134–144)
Total Protein: 6.8 g/dL (ref 6.0–8.5)
eGFR: 76 mL/min/{1.73_m2} (ref >59)

## 2022-06-27 ENCOUNTER — Telehealth: Payer: Self-pay

## 2022-06-27 NOTE — Telephone Encounter (Signed)
Monica Cross (Key: [id number]) PA Case ID #: [id number] Rx #: [id number] Need Help? Call us at [phone] Status sent iconSent to Plan today Drug traMADol HCl 50MG  tablets ePA cloud logo Form [facility] Medicaid of [facility] Electronic Prior Authorization Request Form [phone] NCPDP) Original Claim Info 75 ACUTE PAIN MAX 5 DS. POST OP PAIN SUBMIT PAMC [id number] FOR MAX 7 DS. SUBMIT DX CODE FOR CANCER OR SICKLE CELLSubmit 3 DS For Emerg Fill with PA Type01, PA Number 1111, Level of Service 3 For RxLocal Coupon Price of: $21.48 submit to BIN: [id number] PCN: CP Group: COUPON --Service provided at no cost and no switch fee to the pharmacy-

## 2022-06-27 NOTE — Telephone Encounter (Signed)
Monica Cross (Monica Cross) PA Case ID #: [id number] Rx #: [id number] Need Help? Call us at [phone] Status sent iconSent to Plan today Drug Telmisartan-HCTZ 80-25MG  tablets ePA cloud logo Form [facility] Medicaid of [facility] Electronic Prior Authorization Request Form [phone] NCPDP)

## 2022-06-28 NOTE — Telephone Encounter (Signed)
Denied.  Per the health plan's preferred drug list, at least 2 preferred drugs must be tried before requesting this drug or tell us why the member cannot try any preferred alternatives. Please send [place] supporting chart notes and lab results. Here is list of preferred alternatives: irbesartan-HCTZ tablet, olmesartan-HCTZ tablet, valsartan-HCTZ tablet. Per our records, the member has already tried losartan-HCTZ tablet. Note: Some preferred drug(s) may have quantity limits. Refer to the health plan's preferred drug list for additional details

## 2022-06-28 NOTE — Telephone Encounter (Signed)
The following criteria from the health plan guideline, Opioid Analgesics, must be met before we can approve this request. Please send [place] supporting chart notes and lab results.  The prescribing clinician shall review the [facility] statement on use of controlled substances for the treatment of pain (https://www.ncmedboard.org/resourcesinformation/professionalresources/laws-rules-positionstatements/positionstatements/Policy_for_the_use_of_opiates_for_the_treatment_of_pain) and is adhering as medically appropriate to the guidelines which include: (a) complete beneficiary evaluation, (b) establishment of a treatment plan (contract), (c) informed consent,(d) periodic review, and (e) consultation  with specialistsin various treatment modalities as appropriate.  The prescribing clinician shall check the beneficiary's utilization of controlled substances on the [place] Controlled Substance Reporting System. (https://[email or web address].HybridDrinks.uy).  The prescribing clinician shall review the CDC Guideline for Prescribing Opioids for Chronic Pain -- [place], 2016.(LawyerNetworking.com.cy) and CDC Clinical Practice Guideline for Prescribing Opioids for Pain -- [place], 2022

## 2022-08-03 ENCOUNTER — Telehealth: Payer: Self-pay | Admitting: Family

## 2022-08-03 MED ORDER — LOSARTAN POTASSIUM 100 MG PO TABS: 100.0000 mg | ORAL_TABLET | Freq: Every day | ORAL | 0 refills | Status: DC

## 2022-08-03 MED ORDER — HYDROCHLOROTHIAZIDE 25 MG PO TABS: 25.0000 mg | ORAL_TABLET | Freq: Every day | ORAL | 3 refills | Status: DC

## 2022-08-03 NOTE — Telephone Encounter (Signed)
Patient aware and verbalized understanding. °

## 2022-08-03 NOTE — Telephone Encounter (Signed)
Telmisartan-hydrochlorothiazide 80-25 mg changed to Losartan 100 mg and hydrochlorothiazide 25 mg. Prescription sent to pharmacy.   Jannifer Rodney, FNP

## 2022-08-22 ENCOUNTER — Other Ambulatory Visit: Payer: Self-pay | Admitting: Family

## 2022-08-22 DIAGNOSIS — G43109 Migraine with aura, not intractable, without status migrainosus: Secondary | ICD-10-CM

## 2022-08-30 ENCOUNTER — Other Ambulatory Visit: Payer: Self-pay | Admitting: Family Medicine

## 2022-09-10 ENCOUNTER — Other Ambulatory Visit: Payer: Self-pay | Admitting: Family

## 2022-09-10 DIAGNOSIS — G8929 Other chronic pain: Secondary | ICD-10-CM

## 2022-09-10 DIAGNOSIS — Z79899 Other long term (current) drug therapy: Secondary | ICD-10-CM

## 2022-09-21 ENCOUNTER — Other Ambulatory Visit: Payer: Self-pay | Admitting: Family

## 2022-09-21 DIAGNOSIS — F321 Major depressive disorder, single episode, moderate: Secondary | ICD-10-CM

## 2022-09-21 DIAGNOSIS — I10 Essential (primary) hypertension: Secondary | ICD-10-CM

## 2022-09-21 DIAGNOSIS — F411 Generalized anxiety disorder: Secondary | ICD-10-CM

## 2022-09-25 ENCOUNTER — Encounter: Payer: Self-pay | Admitting: Family

## 2022-09-25 ENCOUNTER — Ambulatory Visit (INDEPENDENT_AMBULATORY_CARE_PROVIDER_SITE_OTHER): Payer: Medicaid Other | Admitting: Family

## 2022-09-25 VITALS — BP 108/76 | HR 76 | Temp 97.9°F | Ht 62.0 in | Wt 175.0 lb

## 2022-09-25 DIAGNOSIS — F321 Major depressive disorder, single episode, moderate: Secondary | ICD-10-CM | POA: Diagnosis not present

## 2022-09-25 DIAGNOSIS — M542 Cervicalgia: Secondary | ICD-10-CM

## 2022-09-25 DIAGNOSIS — E7800 Pure hypercholesterolemia, unspecified: Secondary | ICD-10-CM | POA: Diagnosis not present

## 2022-09-25 DIAGNOSIS — F411 Generalized anxiety disorder: Secondary | ICD-10-CM

## 2022-09-25 DIAGNOSIS — I10 Essential (primary) hypertension: Secondary | ICD-10-CM

## 2022-09-25 DIAGNOSIS — L910 Hypertrophic scar: Secondary | ICD-10-CM | POA: Diagnosis not present

## 2022-09-25 DIAGNOSIS — G8929 Other chronic pain: Secondary | ICD-10-CM

## 2022-09-25 DIAGNOSIS — Z79899 Other long term (current) drug therapy: Secondary | ICD-10-CM

## 2022-09-25 DIAGNOSIS — M25511 Pain in right shoulder: Secondary | ICD-10-CM | POA: Diagnosis not present

## 2022-09-25 DIAGNOSIS — G4700 Insomnia, unspecified: Secondary | ICD-10-CM | POA: Diagnosis not present

## 2022-09-25 MED ORDER — TRAMADOL HCL 50 MG PO TABS
50.0000 mg | ORAL_TABLET | Freq: Two times a day (BID) | ORAL | 2 refills | Status: DC | PRN
Start: 2022-09-25 — End: 2022-12-27

## 2022-09-25 NOTE — Patient Instructions (Signed)
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
## 2022-10-09 ENCOUNTER — Telehealth: Payer: Self-pay

## 2022-10-09 NOTE — Telephone Encounter (Signed)
Monica Cross (Key: [id number]) PA Case ID #: [id number] Rx #: [id number] Need Help? Call us at [phone] Status sent iconSent to Plan today Drug Fenofibric Acid 135MG  dr capsules ePA cloud logo Form [employer] Medicaid Electronic Prior Authorization Form [phone] NCPDP) Original Claim Info 70 PA Req, Dr submit ePA at [employer].comOr MD Call [phone], Possible 3 DSSUBMIT 03 Lvl of Service PA# 72 PA TYPE8 For RxLocal Coupon Price of: $38.37 submit to BIN: [id number] PCN: CP Group: COUPON --Service provided at no cost and no switch fee to the pharmacy--

## 2022-10-10 NOTE — Telephone Encounter (Signed)
Pharmacy Patient Advocate Encounter  Received notification from [facility] that Prior Authorization for Fenofibric Acid 135MG  dr capsules has been DENIED. Please advise how you'd like to proceed. Full denial letter will be uploaded to the media tab. See denial reason below.   PA #/Case ID/Reference #: [id number]   Denial Reason: Have not failed one preferred drug. Preferred drugs: gemfibrozil tablet, omega-3 acid ethyl esters capsule, Vascepa Capsule

## 2022-10-11 NOTE — Telephone Encounter (Signed)
Patient aware and verbalized understanding. °

## 2022-10-11 NOTE — Telephone Encounter (Signed)
Ok to hold, her triglycerides were stable last draw.   Jannifer Rodney, FNP

## 2022-10-23 ENCOUNTER — Encounter: Payer: Self-pay | Admitting: Family

## 2022-10-23 ENCOUNTER — Ambulatory Visit: Payer: Medicaid Other | Admitting: Family

## 2022-10-23 VITALS — BP 111/78 | HR 89 | Temp 97.6°F | Ht 62.0 in | Wt 172.0 lb

## 2022-10-23 DIAGNOSIS — R52 Pain, unspecified: Secondary | ICD-10-CM | POA: Diagnosis not present

## 2022-10-23 DIAGNOSIS — J069 Acute upper respiratory infection, unspecified: Secondary | ICD-10-CM

## 2022-10-23 LAB — VERITOR FLU A/B WAIVED
Influenza A: NEGATIVE
Influenza B: NEGATIVE

## 2022-10-23 LAB — RAPID STREP SCREEN (MED CTR MEBANE ONLY): Strep Gp A Ag, IA W/Reflex: NEGATIVE

## 2022-10-23 LAB — CULTURE, GROUP A STREP

## 2022-10-23 MED ORDER — CETIRIZINE HCL 10 MG PO TABS: 10.0000 mg | ORAL_TABLET | Freq: Every day | ORAL | 1 refills | Status: DC

## 2022-10-23 MED ORDER — PROMETHAZINE-DM 6.25-15 MG/5ML PO SYRP: 5.0000 mL | ORAL_SOLUTION | Freq: Three times a day (TID) | ORAL | 0 refills | Status: DC | PRN

## 2022-10-23 MED ORDER — BENZONATATE 200 MG PO CAPS: 200.0000 mg | ORAL_CAPSULE | Freq: Three times a day (TID) | ORAL | 1 refills | Status: DC | PRN

## 2022-10-23 NOTE — Progress Notes (Signed)
Subjective:    Patient ID: Monica Cross, female    DOB: 1955-09-17, 68 y.o.   MRN: [id number]  Chief Complaint  Patient presents with   Sore Throat    Started Friday    Chills   Cough   Generalized Body Aches   Pt presents to the office today with sore throat, chills, cough that started 4 days ago.  Sore Throat  This is a new problem. The current episode started in the past 7 days. The problem has been waxing and waning. There has been no fever. Associated symptoms include congestion, coughing, diarrhea, ear pain and headaches. Pertinent negatives include no ear discharge, shortness of breath or trouble swallowing. She has tried acetaminophen for the symptoms. The treatment provided mild relief.  Cough Associated symptoms include ear pain and headaches. Pertinent negatives include no shortness of breath.      Review of Systems  HENT:  Positive for congestion and ear pain. Negative for ear discharge and trouble swallowing.   Respiratory:  Positive for cough. Negative for shortness of breath.   Gastrointestinal:  Positive for diarrhea.  Neurological:  Positive for headaches.  All other systems reviewed and are negative.      Objective:   Physical Exam Vitals reviewed.  Constitutional:      General: She is not in acute distress.    Appearance: She is well-developed.  HENT:     Head: Normocephalic and atraumatic.     Right Ear: External ear normal.  Eyes:     Pupils: Pupils are equal, round, and reactive to light.  Neck:     Thyroid: No thyromegaly.  Cardiovascular:     Rate and Rhythm: Normal rate and regular rhythm.     Heart sounds: Normal heart sounds. No murmur heard. Pulmonary:     Effort: Pulmonary effort is normal. No respiratory distress.     Breath sounds: Normal breath sounds. No wheezing.     Comments: Dry cough Abdominal:     General: Bowel sounds are normal. There is no distension.     Palpations: Abdomen is soft.     Tenderness: There is no abdominal  tenderness.  Musculoskeletal:        General: No tenderness. Normal range of motion.     Cervical back: Normal range of motion and neck supple.  Skin:    General: Skin is warm and dry.  Neurological:     Mental Status: She is alert and oriented to person, place, and time.     Cranial Nerves: No cranial nerve deficit.     Deep Tendon Reflexes: Reflexes are normal and symmetric.  Psychiatric:        Behavior: Behavior normal.        Thought Content: Thought content normal.        Judgment: Judgment normal.      BP 111/78   Pulse 89   Temp 97.6 F (36.4 C) (Temporal)   Ht 5\' 2"  (1.575 m)   Wt 172 lb (78 kg)   SpO2 94%   BMI 31.46 kg/m       Assessment & Plan:  Monica Cross comes in today with chief complaint of Sore Throat (Started Friday ), Chills, Cough, and Generalized Body Aches   Diagnosis and orders addressed:  1. Body aches - Novel Coronavirus, NAA ([facility]) - Veritor Flu A/B Waived - Rapid Strep Screen ([facility] ONLY)  2. Viral URI - Take meds as prescribed - Use a cool mist humidifier  -  Use saline nose sprays frequently -Force fluids -For any cough or congestion  Use plain Mucinex- regular strength or max strength is fine -For fever or aces or pains- take tylenol or ibuprofen. -Throat lozenges if help -New toothbrush in 3 days Follow up if symptoms worsen or do not improve  - benzonatate (TESSALON) 200 MG capsule; Take 1 capsule (200 mg total) by mouth 3 (three) times daily as needed.  Dispense: 30 capsule; Refill: 1 - promethazine-dextromethorphan (PROMETHAZINE-DM) 6.25-15 MG/5ML syrup; Take 5 mLs by mouth 3 (three) times daily as needed for cough.  Dispense: 118 mL; Refill: 0 - cetirizine (ZYRTEC ALLERGY) 10 MG tablet; Take 1 tablet (10 mg total) by mouth daily.  Dispense: 90 tablet; Refill: 1   Jannifer Rodney, FNP

## 2022-10-23 NOTE — Patient Instructions (Signed)

## 2022-10-24 LAB — NOVEL CORONAVIRUS, NAA: SARS-CoV-2, NAA: NOT DETECTED

## 2022-11-10 DIAGNOSIS — H5213 Myopia, bilateral: Secondary | ICD-10-CM | POA: Diagnosis not present

## 2022-12-14 ENCOUNTER — Other Ambulatory Visit: Payer: Self-pay | Admitting: Family

## 2022-12-14 DIAGNOSIS — Z79899 Other long term (current) drug therapy: Secondary | ICD-10-CM

## 2022-12-14 DIAGNOSIS — E7800 Pure hypercholesterolemia, unspecified: Secondary | ICD-10-CM

## 2022-12-14 DIAGNOSIS — G8929 Other chronic pain: Secondary | ICD-10-CM

## 2022-12-27 ENCOUNTER — Ambulatory Visit (INDEPENDENT_AMBULATORY_CARE_PROVIDER_SITE_OTHER): Payer: Medicare Other | Admitting: Family

## 2022-12-27 ENCOUNTER — Encounter: Payer: Self-pay | Admitting: Family

## 2022-12-27 VITALS — BP 127/75 | HR 76 | Temp 97.2°F | Ht 62.0 in | Wt 175.0 lb

## 2022-12-27 DIAGNOSIS — M542 Cervicalgia: Secondary | ICD-10-CM | POA: Diagnosis not present

## 2022-12-27 DIAGNOSIS — Z0001 Encounter for general adult medical examination with abnormal findings: Secondary | ICD-10-CM

## 2022-12-27 DIAGNOSIS — Z79899 Other long term (current) drug therapy: Secondary | ICD-10-CM | POA: Diagnosis not present

## 2022-12-27 DIAGNOSIS — L910 Hypertrophic scar: Secondary | ICD-10-CM

## 2022-12-27 DIAGNOSIS — G43109 Migraine with aura, not intractable, without status migrainosus: Secondary | ICD-10-CM

## 2022-12-27 DIAGNOSIS — M5136 Other intervertebral disc degeneration, lumbar region with discogenic back pain only: Secondary | ICD-10-CM | POA: Diagnosis not present

## 2022-12-27 DIAGNOSIS — Z0000 Encounter for general adult medical examination without abnormal findings: Secondary | ICD-10-CM

## 2022-12-27 DIAGNOSIS — K5900 Constipation, unspecified: Secondary | ICD-10-CM

## 2022-12-27 DIAGNOSIS — G4700 Insomnia, unspecified: Secondary | ICD-10-CM

## 2022-12-27 DIAGNOSIS — G8929 Other chronic pain: Secondary | ICD-10-CM

## 2022-12-27 DIAGNOSIS — E7800 Pure hypercholesterolemia, unspecified: Secondary | ICD-10-CM

## 2022-12-27 DIAGNOSIS — M170 Bilateral primary osteoarthritis of knee: Secondary | ICD-10-CM

## 2022-12-27 DIAGNOSIS — Z23 Encounter for immunization: Secondary | ICD-10-CM

## 2022-12-27 DIAGNOSIS — I10 Essential (primary) hypertension: Secondary | ICD-10-CM

## 2022-12-27 DIAGNOSIS — F411 Generalized anxiety disorder: Secondary | ICD-10-CM

## 2022-12-27 DIAGNOSIS — F321 Major depressive disorder, single episode, moderate: Secondary | ICD-10-CM

## 2022-12-27 MED ORDER — TRAMADOL HCL 50 MG PO TABS: 50.0000 mg | ORAL_TABLET | Freq: Two times a day (BID) | ORAL | 2 refills | Status: DC | PRN
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
## 2022-12-27 NOTE — Patient Instructions (Signed)

## 2022-12-28 ENCOUNTER — Other Ambulatory Visit: Payer: Self-pay | Admitting: Family

## 2022-12-28 LAB — CMP14+EGFR
ALT: 20 [IU]/L (ref 0–32)
AST: 19 [IU]/L (ref 0–40)
Albumin: 4.1 g/dL (ref 3.9–4.9)
Alkaline Phosphatase: 103 [IU]/L (ref 44–121)
BUN/Creatinine Ratio: 26 (ref 12–28)
BUN: 21 mg/dL (ref 8–27)
Bilirubin Total: 0.3 mg/dL (ref 0.0–1.2)
CO2: 25 mmol/L (ref 20–29)
Calcium: 9.8 mg/dL (ref 8.7–10.3)
Chloride: 102 mmol/L (ref 96–106)
Creatinine, Ser: 0.82 mg/dL (ref 0.57–1.00)
Globulin, Total: 2.3 g/dL (ref 1.5–4.5)
Glucose: 102 mg/dL — ABNORMAL HIGH (ref 70–99)
Potassium: 4.2 mmol/L (ref 3.5–5.2)
Sodium: 142 mmol/L (ref 134–144)
Total Protein: 6.4 g/dL (ref 6.0–8.5)
eGFR: 80 mL/min/{1.73_m2} (ref >59)

## 2022-12-28 LAB — LIPID PANEL
Chol/HDL Ratio: 4.1 ratio (ref 0.0–4.4)
Cholesterol, Total: 179 mg/dL (ref 100–199)
HDL: 44 mg/dL (ref >39)
LDL Chol Calc (NIH): 113 mg/dL — ABNORMAL HIGH (ref 0–99)
Triglycerides: 125 mg/dL (ref 0–149)
VLDL Cholesterol Cal: 22 mg/dL (ref 5–40)

## 2022-12-28 LAB — CBC WITH DIFFERENTIAL/PLATELET
Basophils Absolute: 0.1 10*3/uL (ref 0.0–0.2)
Basos: 1 %
EOS (ABSOLUTE): 0.1 10*3/uL (ref 0.0–0.4)
Eos: 2 %
Hematocrit: 39.4 % (ref 34.0–46.6)
Hemoglobin: 12.9 g/dL (ref 11.1–15.9)
Immature Grans (Abs): 0 10*3/uL (ref 0.0–0.1)
Immature Granulocytes: 0 %
Lymphocytes Absolute: 2.2 10*3/uL (ref 0.7–3.1)
Lymphs: 40 %
MCH: 31.2 pg (ref 26.6–33.0)
MCHC: 32.7 g/dL (ref 31.5–35.7)
MCV: 95 fL (ref 79–97)
Monocytes Absolute: 0.3 10*3/uL (ref 0.1–0.9)
Monocytes: 6 %
Neutrophils Absolute: 2.8 10*3/uL (ref 1.4–7.0)
Neutrophils: 51 %
Platelets: 386 10*3/uL (ref 150–450)
RBC: 4.14 x10E6/uL (ref 3.77–5.28)
RDW: 13.4 % (ref 11.7–15.4)
WBC: 5.4 10*3/uL (ref 3.4–10.8)

## 2022-12-28 MED ORDER — ATORVASTATIN CALCIUM 40 MG PO TABS: 40.0000 mg | ORAL_TABLET | Freq: Every day | ORAL | 4 refills | Status: DC

## 2022-12-31 ENCOUNTER — Telehealth: Payer: Self-pay

## 2022-12-31 ENCOUNTER — Other Ambulatory Visit (HOSPITAL_COMMUNITY): Payer: Self-pay

## 2022-12-31 ENCOUNTER — Other Ambulatory Visit: Payer: Self-pay | Admitting: Family

## 2022-12-31 NOTE — Telephone Encounter (Signed)
Pharmacy Patient Advocate Encounter   Received notification from Fax that prior authorization for Tramadol 50mg  is required/requested.   Insurance verification completed.   The patient is insured through [facility] .   Per test claim: PA required; PA submitted to above mentioned insurance via CoverMyMeds Key/confirmation #/EOC [id number] Status is pending

## 2023-01-01 ENCOUNTER — Other Ambulatory Visit (HOSPITAL_COMMUNITY): Payer: Self-pay

## 2023-01-01 NOTE — Telephone Encounter (Signed)
Pharmacy Patient Advocate Encounter  Received notification from [facility] that Prior Authorization for Tramadol 50mg  has been APPROVED from 12/31/22 to 01/30/23. Ran test claim, Copay is $4.50. This test claim was processed through [facility]- copay amounts may vary at other pharmacies due to pharmacy/plan contracts, or as the patient moves through the different stages of their insurance plan.   PA #/Case ID/Reference #: [id number]

## 2023-01-22 ENCOUNTER — Other Ambulatory Visit: Payer: Self-pay | Admitting: Family

## 2023-01-22 DIAGNOSIS — G43109 Migraine with aura, not intractable, without status migrainosus: Secondary | ICD-10-CM

## 2023-01-22 DIAGNOSIS — R519 Headache, unspecified: Secondary | ICD-10-CM

## 2023-01-23 IMAGING — RF DG CERVICAL SPINE 1V
1 series · 1 of 1 positions shown · non-contrast
Comparison: Cervical spine MRI 08/08/2020.

CLINICAL DATA: Elective surgery 5HU.L ([id number]). Additional
history provided by [HOSPITAL]-5 ACDF. Provided fluoroscopy
time 9 seconds (0.7005 mGy).

EXAM:
DG CERVICAL SPINE - 1 VIEW; DG C-ARM 1-60 MIN

[Series 1: unknown protocol · 0.14mm/px · 1 of 1 slices shown]
[im 1/1]
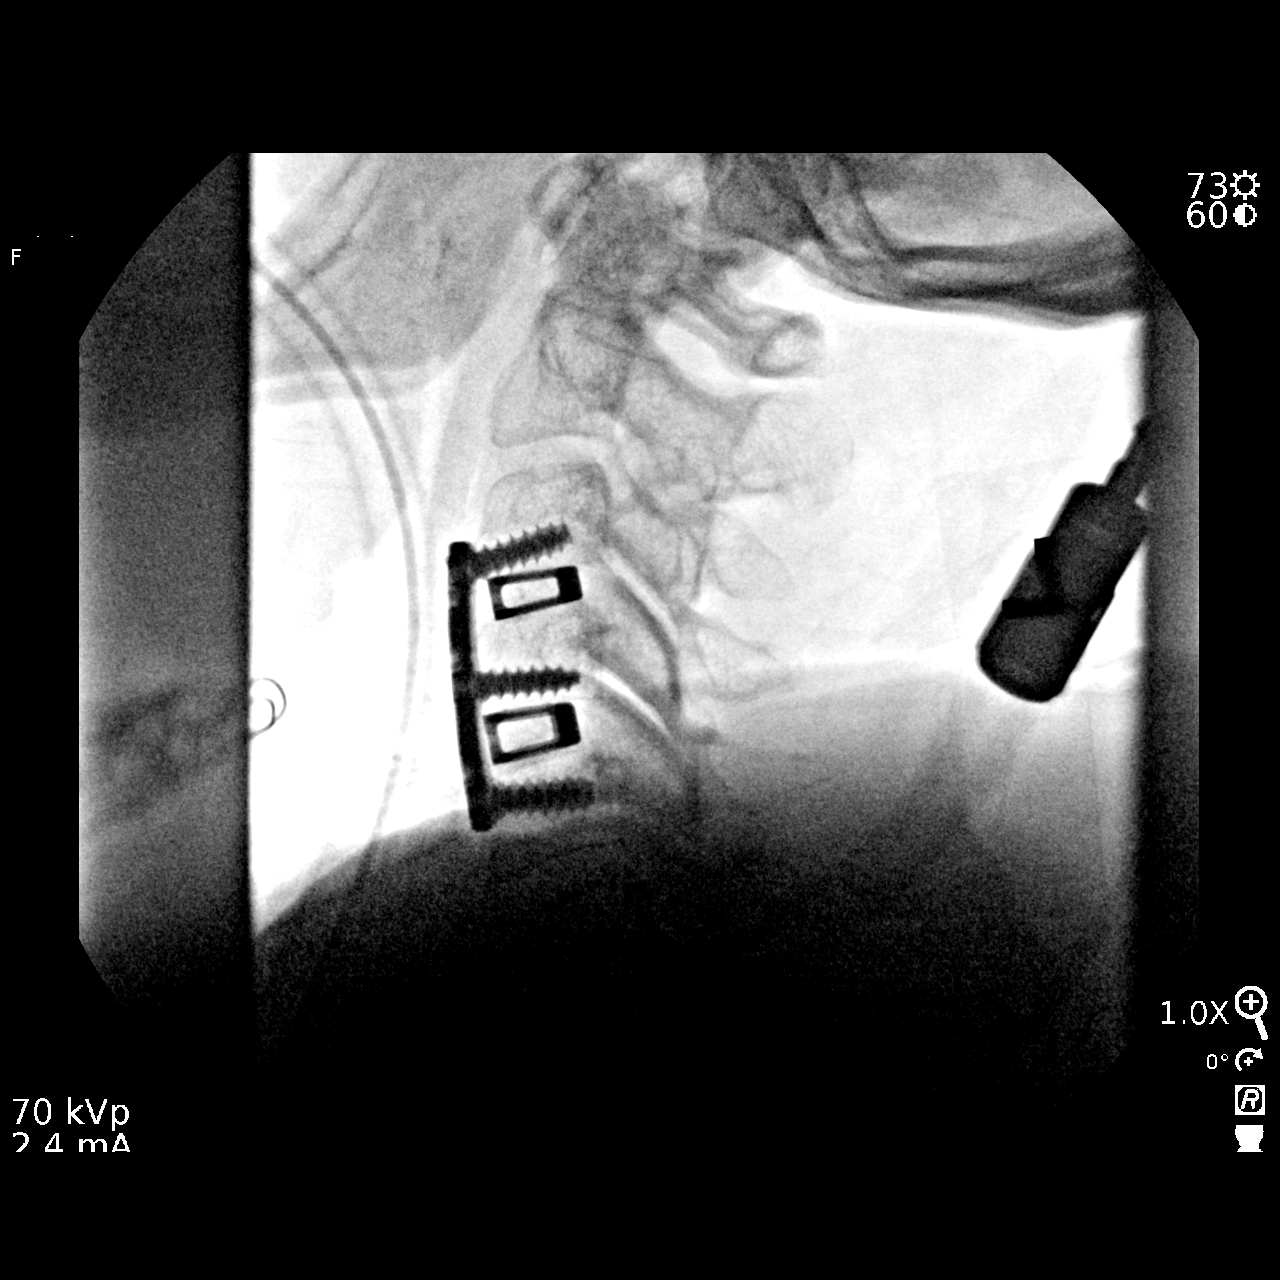

[1 of 1 positions shown; findings below may reference images not displayed]

FINDINGS: A single lateral view intraoperative fluoroscopic image of the
cervical spine is submitted. On the provided image, ACDF hardware is
present at the C3-C5 levels (ventral plate and screws, as well as
interbody devices). Partially visualized ET tube.
IMPRESSION: Single lateral view intraoperative fluoroscopic image of the
cervical spine from C3-C5 ACDF.

## 2023-03-05 ENCOUNTER — Other Ambulatory Visit: Payer: Self-pay | Admitting: Family

## 2023-03-05 DIAGNOSIS — G43109 Migraine with aura, not intractable, without status migrainosus: Secondary | ICD-10-CM

## 2023-03-14 ENCOUNTER — Other Ambulatory Visit: Payer: Self-pay | Admitting: Family

## 2023-03-14 DIAGNOSIS — G8929 Other chronic pain: Secondary | ICD-10-CM

## 2023-03-14 DIAGNOSIS — Z79899 Other long term (current) drug therapy: Secondary | ICD-10-CM

## 2023-03-29 ENCOUNTER — Encounter: Payer: Self-pay | Admitting: Family

## 2023-03-29 ENCOUNTER — Telehealth: Payer: 59 | Admitting: Family

## 2023-03-29 DIAGNOSIS — M542 Cervicalgia: Secondary | ICD-10-CM

## 2023-03-29 DIAGNOSIS — G8929 Other chronic pain: Secondary | ICD-10-CM | POA: Diagnosis not present

## 2023-03-29 DIAGNOSIS — F411 Generalized anxiety disorder: Secondary | ICD-10-CM

## 2023-03-29 DIAGNOSIS — I10 Essential (primary) hypertension: Secondary | ICD-10-CM | POA: Diagnosis not present

## 2023-03-29 DIAGNOSIS — G43109 Migraine with aura, not intractable, without status migrainosus: Secondary | ICD-10-CM

## 2023-03-29 DIAGNOSIS — Z79899 Other long term (current) drug therapy: Secondary | ICD-10-CM | POA: Diagnosis not present

## 2023-03-29 DIAGNOSIS — F321 Major depressive disorder, single episode, moderate: Secondary | ICD-10-CM

## 2023-03-29 DIAGNOSIS — R519 Headache, unspecified: Secondary | ICD-10-CM

## 2023-03-29 MED ORDER — SUMATRIPTAN SUCCINATE 50 MG PO TABS: ORAL_TABLET | ORAL | 1 refills | Status: DC

## 2023-03-29 MED ORDER — TOPIRAMATE 100 MG PO TABS: 100.0000 mg | ORAL_TABLET | Freq: Two times a day (BID) | ORAL | 0 refills | Status: DC

## 2023-03-29 MED ORDER — TRAMADOL HCL 50 MG PO TABS: 50.0000 mg | ORAL_TABLET | Freq: Two times a day (BID) | ORAL | 2 refills | Status: DC | PRN

## 2023-03-29 MED ORDER — LOSARTAN POTASSIUM 100 MG PO TABS: 100.0000 mg | ORAL_TABLET | Freq: Every day | ORAL | 0 refills | Status: DC

## 2023-03-29 MED ORDER — DESVENLAFAXINE SUCCINATE ER 50 MG PO TB24: 50.0000 mg | ORAL_TABLET | Freq: Every day | ORAL | 0 refills | Status: DC

## 2023-03-29 MED ORDER — HYDROCHLOROTHIAZIDE 25 MG PO TABS: 25.0000 mg | ORAL_TABLET | Freq: Every day | ORAL | 3 refills | Status: DC

## 2023-03-29 MED ORDER — AMLODIPINE BESYLATE 5 MG PO TABS: 5.0000 mg | ORAL_TABLET | Freq: Every day | ORAL | 0 refills | Status: DC
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
## 2023-04-12 ENCOUNTER — Other Ambulatory Visit: Payer: Self-pay | Admitting: Family

## 2023-04-12 DIAGNOSIS — G8929 Other chronic pain: Secondary | ICD-10-CM

## 2023-04-12 DIAGNOSIS — G43109 Migraine with aura, not intractable, without status migrainosus: Secondary | ICD-10-CM

## 2023-04-12 DIAGNOSIS — R519 Headache, unspecified: Secondary | ICD-10-CM

## 2023-05-07 DIAGNOSIS — M545 Low back pain, unspecified: Secondary | ICD-10-CM | POA: Diagnosis not present

## 2023-05-07 DIAGNOSIS — M791 Myalgia, unspecified site: Secondary | ICD-10-CM | POA: Diagnosis not present

## 2023-06-10 ENCOUNTER — Other Ambulatory Visit: Payer: Self-pay | Admitting: Family

## 2023-06-10 DIAGNOSIS — G8929 Other chronic pain: Secondary | ICD-10-CM

## 2023-06-10 DIAGNOSIS — Z79899 Other long term (current) drug therapy: Secondary | ICD-10-CM

## 2023-06-20 ENCOUNTER — Encounter (HOSPITAL_COMMUNITY): Payer: Self-pay

## 2023-06-27 ENCOUNTER — Encounter: Payer: Self-pay | Admitting: Family

## 2023-06-27 ENCOUNTER — Ambulatory Visit: Payer: 59 | Admitting: Family

## 2023-06-27 VITALS — BP 105/69 | HR 71 | Temp 97.2°F | Ht 62.0 in | Wt 173.0 lb

## 2023-06-27 DIAGNOSIS — Z23 Encounter for immunization: Secondary | ICD-10-CM

## 2023-06-27 DIAGNOSIS — M542 Cervicalgia: Secondary | ICD-10-CM | POA: Diagnosis not present

## 2023-06-27 DIAGNOSIS — F321 Major depressive disorder, single episode, moderate: Secondary | ICD-10-CM

## 2023-06-27 DIAGNOSIS — G8929 Other chronic pain: Secondary | ICD-10-CM

## 2023-06-27 DIAGNOSIS — Z79899 Other long term (current) drug therapy: Secondary | ICD-10-CM

## 2023-06-27 DIAGNOSIS — Z9189 Other specified personal risk factors, not elsewhere classified: Secondary | ICD-10-CM

## 2023-06-27 DIAGNOSIS — F411 Generalized anxiety disorder: Secondary | ICD-10-CM

## 2023-06-27 DIAGNOSIS — R519 Headache, unspecified: Secondary | ICD-10-CM

## 2023-06-27 DIAGNOSIS — E785 Hyperlipidemia, unspecified: Secondary | ICD-10-CM | POA: Diagnosis not present

## 2023-06-27 DIAGNOSIS — G43109 Migraine with aura, not intractable, without status migrainosus: Secondary | ICD-10-CM

## 2023-06-27 DIAGNOSIS — I10 Essential (primary) hypertension: Secondary | ICD-10-CM | POA: Diagnosis not present

## 2023-06-27 LAB — CMP14+EGFR
ALT: 21 IU/L (ref 0–32)
AST: 19 IU/L (ref 0–40)
Albumin: 4.2 g/dL (ref 3.9–4.9)
Alkaline Phosphatase: 104 IU/L (ref 44–121)
BUN/Creatinine Ratio: 21 (ref 12–28)
BUN: 21 mg/dL (ref 8–27)
Bilirubin Total: 0.5 mg/dL (ref 0.0–1.2)
CO2: 24 mmol/L (ref 20–29)
Calcium: 9.9 mg/dL (ref 8.7–10.3)
Chloride: 100 mmol/L (ref 96–106)
Creatinine, Ser: 1.01 mg/dL — ABNORMAL HIGH (ref 0.57–1.00)
Globulin, Total: 2.2 g/dL (ref 1.5–4.5)
Glucose: 98 mg/dL (ref 70–99)
Potassium: 3.8 mmol/L (ref 3.5–5.2)
Sodium: 139 mmol/L (ref 134–144)
Total Protein: 6.4 g/dL (ref 6.0–8.5)
eGFR: 62 mL/min/{1.73_m2} (ref >59)

## 2023-06-27 MED ORDER — SUMATRIPTAN SUCCINATE 50 MG PO TABS: ORAL_TABLET | ORAL | 1 refills | Status: DC

## 2023-06-27 MED ORDER — TOPIRAMATE 100 MG PO TABS: 100.0000 mg | ORAL_TABLET | Freq: Two times a day (BID) | ORAL | 0 refills | Status: DC

## 2023-06-27 MED ORDER — ATORVASTATIN CALCIUM 40 MG PO TABS: 40.0000 mg | ORAL_TABLET | Freq: Every day | ORAL | 4 refills | Status: AC

## 2023-06-27 MED ORDER — DESVENLAFAXINE SUCCINATE ER 50 MG PO TB24: 50.0000 mg | ORAL_TABLET | Freq: Every day | ORAL | 0 refills | Status: DC

## 2023-06-27 MED ORDER — TRAMADOL HCL 50 MG PO TABS
50.0000 mg | ORAL_TABLET | Freq: Two times a day (BID) | ORAL | 2 refills | Status: DC | PRN
Start: 2023-06-27 — End: 2023-10-07

## 2023-06-27 MED ORDER — LOSARTAN POTASSIUM 100 MG PO TABS: 100.0000 mg | ORAL_TABLET | Freq: Every day | ORAL | 0 refills | Status: DC

## 2023-06-27 MED ORDER — AMLODIPINE BESYLATE 5 MG PO TABS: 5.0000 mg | ORAL_TABLET | Freq: Every day | ORAL | 0 refills | Status: DC

## 2023-06-27 MED ORDER — HYDROCHLOROTHIAZIDE 25 MG PO TABS: 25.0000 mg | ORAL_TABLET | Freq: Every day | ORAL | 3 refills | Status: DC
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
## 2023-06-27 NOTE — Patient Instructions (Signed)
 Health Maintenance After Age 66 After age 4, you are at a higher risk for certain long-term diseases and infections as well as injuries from falls. Falls are a major cause of broken bones and head injuries in people who are older than age 47. Getting regular preventive care can help to keep you healthy and well. Preventive care includes getting regular testing and making lifestyle changes as recommended by your health care provider. Talk with your health care provider about: Which screenings and tests you should have. A screening is a test that checks for a disease when you have no symptoms. A diet and exercise plan that is right for you. What should I know about screenings and tests to prevent falls? Screening and testing are the best ways to find a health problem early. Early diagnosis and treatment give you the best chance of managing medical conditions that are common after age 37. Certain conditions and lifestyle choices may make you more likely to have a fall. Your health care provider may recommend: Regular vision checks. Poor vision and conditions such as cataracts can make you more likely to have a fall. If you wear glasses, make sure to get your prescription updated if your vision changes. Medicine review. Work with your health care provider to regularly review all of the medicines you are taking, including over-the-counter medicines. Ask your health care provider about any side effects that may make you more likely to have a fall. Tell your health care provider if any medicines that you take make you feel dizzy or sleepy. Strength and balance checks. Your health care provider may recommend certain tests to check your strength and balance while standing, walking, or changing positions. Foot health exam. Foot pain and numbness, as well as not wearing proper footwear, can make you more likely to have a fall. Screenings, including: Osteoporosis screening. Osteoporosis is a condition that causes  the bones to get weaker and break more easily. Blood pressure screening. Blood pressure changes and medicines to control blood pressure can make you feel dizzy. Depression screening. You may be more likely to have a fall if you have a fear of falling, feel depressed, or feel unable to do activities that you used to do. Alcohol use screening. Using too much alcohol can affect your balance and may make you more likely to have a fall. Follow these instructions at home: Lifestyle Do not drink alcohol if: Your health care provider tells you not to drink. If you drink alcohol: Limit how much you have to: 0-1 drink a day for women. 0-2 drinks a day for men. Know how much alcohol is in your drink. In the [place] one drink equals one 12 oz bottle of beer (355 mL), one 5 oz glass of wine (148 mL), or one 1 oz glass of hard liquor (44 mL). Do not use any products that contain nicotine or tobacco. These products include cigarettes, chewing tobacco, and vaping devices, such as e-cigarettes. If you need help quitting, ask your health care provider. Activity  Follow a regular exercise program to stay fit. This will help you maintain your balance. Ask your health care provider what types of exercise are appropriate for you. If you need a cane or walker, use it as recommended by your health care provider. Wear supportive shoes that have nonskid soles. Safety  Remove any tripping hazards, such as rugs, cords, and clutter. Install safety equipment such as grab bars in bathrooms and safety rails on stairs. Keep rooms and walkways  well-lit. General instructions Talk with your health care provider about your risks for falling. Tell your health care provider if: You fall. Be sure to tell your health care provider about all falls, even ones that seem minor. You feel dizzy, tiredness (fatigue), or off-balance. Take over-the-counter and prescription medicines only as told by your health care provider. These include  supplements. Eat a healthy diet and maintain a healthy weight. A healthy diet includes low-fat dairy products, low-fat (lean) meats, and fiber from whole grains, beans, and lots of fruits and vegetables. Stay current with your vaccines. Schedule regular health, dental, and eye exams. Summary Having a healthy lifestyle and getting preventive care can help to protect your health and wellness after age 11. Screening and testing are the best way to find a health problem early and help you avoid having a fall. Early diagnosis and treatment give you the best chance for managing medical conditions that are more common for people who are older than age 28. Falls are a major cause of broken bones and head injuries in people who are older than age 48. Take precautions to prevent a fall at home. Work with your health care provider to learn what changes you can make to improve your health and wellness and to prevent falls. This information is not intended to replace advice given to you by your health care provider. Make sure you discuss any questions you have with your health care provider. Document Revised: 06/20/2020 Document Reviewed: 06/20/2020 Elsevier Patient Education  2024 [employer].

## 2023-06-28 ENCOUNTER — Ambulatory Visit: Payer: Self-pay | Admitting: Family

## 2023-08-15 ENCOUNTER — Ambulatory Visit: Payer: Self-pay | Admitting: Family

## 2023-08-15 ENCOUNTER — Ambulatory Visit: Payer: 59 | Admitting: Family

## 2023-08-15 ENCOUNTER — Encounter: Payer: Self-pay | Admitting: Family

## 2023-08-15 VITALS — BP 99/64 | HR 75 | Temp 97.8°F | Ht 62.0 in | Wt 175.6 lb

## 2023-08-15 DIAGNOSIS — E7800 Pure hypercholesterolemia, unspecified: Secondary | ICD-10-CM

## 2023-08-15 DIAGNOSIS — Z0001 Encounter for general adult medical examination with abnormal findings: Secondary | ICD-10-CM | POA: Diagnosis not present

## 2023-08-15 DIAGNOSIS — R9431 Abnormal electrocardiogram [ECG] [EKG]: Secondary | ICD-10-CM | POA: Diagnosis not present

## 2023-08-15 DIAGNOSIS — I10 Essential (primary) hypertension: Secondary | ICD-10-CM

## 2023-08-15 DIAGNOSIS — Z0000 Encounter for general adult medical examination without abnormal findings: Secondary | ICD-10-CM

## 2023-08-15 DIAGNOSIS — Z8249 Family history of ischemic heart disease and other diseases of the circulatory system: Secondary | ICD-10-CM

## 2023-08-15 DIAGNOSIS — L259 Unspecified contact dermatitis, unspecified cause: Secondary | ICD-10-CM

## 2023-08-15 MED ORDER — PREDNISONE 10 MG (21) PO TBPK: ORAL_TABLET | ORAL | 0 refills | Status: DC

## 2023-08-15 MED ORDER — TRIAMCINOLONE ACETONIDE 0.5 % EX OINT: 1.0000 | TOPICAL_OINTMENT | Freq: Two times a day (BID) | CUTANEOUS | 0 refills | Status: DC

## 2023-08-15 NOTE — Patient Instructions (Signed)
 Contact Dermatitis Dermatitis is redness, soreness, and swelling (inflammation) of the skin. Contact dermatitis is a reaction to certain substances that touch the skin. There are two types of this condition: Irritant contact dermatitis. This is the most common type. It happens when something irritates your skin, such as when your hands get dry from washing them too often with soap. You can get this type of reaction even if you have not been exposed to the irritant before. Allergic contact dermatitis. This type is caused by a substance that you are allergic to, such as poison ivy. It occurs when you have been exposed to the substance (allergen) and form a sensitivity to it. In some cases, the reaction may start soon after your first exposure to the allergen. In other cases, it may not start until you are exposed to the allergen again. It may then occur every time you are exposed to the allergen in the future. What are the causes? Irritant contact dermatitis is often caused by exposure to: Makeup. Soaps, detergents, and bleaches. Acids. Metal salts, such as nickel. Allergic contact dermatitis is often caused by exposure to: Poisonous plants. Chemicals. Jewelry. Latex. Medicines. Preservatives in products, such as clothes. What increases the risk? You are more likely to get this condition if you have: A job that exposes you to irritants or allergens. Certain medical conditions. These include asthma and eczema. What are the signs or symptoms? Symptoms of this condition may occur in any place on your body that has been touched by the irritant. Symptoms include: Dryness, flaking, or cracking. Redness. Itching. Pain or a burning feeling. Blisters. Drainage of small amounts of blood or clear fluid from skin cracks. With allergic contact dermatitis, there may also be swelling in areas such as the eyelids, mouth, or genitals. How is this diagnosed? This condition is diagnosed with a medical  history and physical exam. A patch skin test may be done to help figure out the cause. If the condition is related to your job, you may need to see an expert in health problems in the workplace (occupational medicine specialist). How is this treated? This condition is treated by staying away from the cause of the reaction and protecting your skin from further contact. Treatment may also include: Steroid creams or ointments. Steroid medicines may need be taken by mouth (orally) in more severe cases. Antibiotics or medicines applied to the skin to kill bacteria (antibacterial ointments). These may be needed if a skin infection is present. Antihistamines. These may be taken orally or put on as a lotion to ease itching. A bandage (dressing). Follow these instructions at home: Skin care Moisturize your skin as needed. Put cool, wet cloths (cool compresses) on the affected areas. Try applying baking soda paste to your skin. Stir water into baking soda until it has the consistency of a paste. Do not scratch your skin. Avoid friction to the affected area. Avoid the use of soaps, perfumes, and dyes. Check the affected areas every day for signs of infection. Check for: More redness, swelling, or pain. More fluid or blood. Warmth. Pus or a bad smell. Medicines Take or apply over-the-counter and prescription medicines only as told by your health care provider. If you were prescribed antibiotics, take or apply them as told by your health care provider. Do not stop using the antibiotic even if you start to feel better. Bathing Try taking a bath with: Epsom salts. Follow the instructions on the packaging. You can get these at your local pharmacy  or grocery store. Baking soda. Pour a small amount into the bath as told by your health care provider. Colloidal oatmeal. Follow the instructions on the packaging. You can get this at your local pharmacy or grocery store. Bathe less often. This may mean bathing  every other day. Bathe in lukewarm water. Avoid using hot water. Bandage care If you were given a dressing, change it as told by your health care provider. Wash your hands with soap and water for at least 20 seconds before and after you change your dressing. If soap and water are not available, use hand sanitizer. General instructions Avoid the substance that caused your reaction. If you do not know what caused it, keep a journal to try to track what caused it. Write down: What you eat and drink. What cosmetics you use. What you wear in the affected area. This includes jewelry. Contact a health care provider if: Your condition does not get better with treatment. Your condition gets worse. You have any signs of infection. You have a fever. You have new symptoms. Your bone or joint under the affected area becomes painful after the skin has healed. Get help right away if: You notice red streaks coming from the affected area. The affected area turns darker. You have trouble breathing. This information is not intended to replace advice given to you by your health care provider. Make sure you discuss any questions you have with your health care provider. Document Revised: 08/04/2021 Document Reviewed: 08/04/2021 Elsevier Patient Education  2024 [employer].

## 2023-08-15 NOTE — Progress Notes (Signed)
 Subjective:    Monica Cross is a 68 y.o. female who presents for a Welcome to Medicare exam.   Cardiac Risk Factors include: advanced age (>33men, >64 women)      Objective:    Today's Vitals   08/15/23 0904  BP: 99/64  Pulse: 75  Temp: 97.8 F (36.6 C)  TempSrc: Temporal  SpO2: 95%  Weight: 175 lb 9.6 oz (79.7 kg)  Height: 5' 2 (1.575 m)  Body mass index is 32.12 kg/m.  Medications Outpatient Encounter Medications as of 08/15/2023  Medication Sig   amLODipine  (NORVASC ) 5 MG tablet Take 1 tablet (5 mg total) by mouth daily.   atorvastatin  (LIPITOR) 40 MG tablet Take 1 tablet (40 mg total) by mouth daily.   desvenlafaxine  (PRISTIQ ) 50 MG 24 hr tablet Take 1 tablet (50 mg total) by mouth daily.   hydrochlorothiazide  (HYDRODIURIL ) 25 MG tablet Take 1 tablet (25 mg total) by mouth daily.   losartan  (COZAAR ) 100 MG tablet Take 1 tablet (100 mg total) by mouth daily.   Omega-3 1000 MG CAPS Take 1,000 mg by mouth daily.   SUMAtriptan  (IMITREX ) 50 MG tablet TAKE ONE TABLET AS NEEDED FOR HEADACHE. May repeat in 2 hours if headache persists or recurs.   topiramate  (TOPAMAX ) 100 MG tablet Take 1 tablet (100 mg total) by mouth 2 (two) times daily.   traMADol  (ULTRAM ) 50 MG tablet Take 1 tablet (50 mg total) by mouth every 12 (twelve) hours as needed.   No facility-administered encounter medications on file as of 08/15/2023.     History: Past Medical History:  Diagnosis Date   Anxiety    Arthritis    Left and right hip   Carpal tunnel syndrome    Bilat   Depression    Headache    Hyperlipidemia    Hypertension    Insomnia    patient denies   Osteoporosis    patient denies   Vision abnormalities    Past Surgical History:  Procedure Laterality Date   ANTERIOR CERVICAL DECOMP/DISCECTOMY FUSION N/A 09/14/2020   Procedure: ANTERIOR CERVICAL DECOMPRESSION FUSION CERVICAL 3- CERVICAL 4, CERVICAL 4- CERVICAL 5 WITH INSTRUMENTATION AND ALLOGRAFT;  Surgeon: Beuford Anes, MD;   Location: [facility] OR;  Service: Orthopedics;  Laterality: N/A;   CARPAL TUNNEL RELEASE     CESAREAN SECTION     COLONOSCOPY WITH PROPOFOL  N/A 11/07/2020   Procedure: COLONOSCOPY WITH PROPOFOL ;  Surgeon: Shaaron Lamar HERO, MD;  Location: [facility];  Service: Endoscopy;  Laterality: N/A;  10:45am   COSMETIC SURGERY     POLYPECTOMY  11/07/2020   Procedure: POLYPECTOMY;  Surgeon: Shaaron Lamar HERO, MD;  Location: [facility];  Service: Endoscopy;;    Family History  Problem Relation Age of Onset   Heart disease Mother    Hypertension Mother    Other Father        head injury   Stroke Sister    Osteoporosis Sister    Lung cancer Brother    Liver cancer Brother    Breast cancer Sister    Osteoporosis Sister    COPD Sister    Osteoporosis Sister    Healthy Sister    Osteoporosis Sister    Other Sister        benign brain tumor   Osteoporosis Sister    Colon cancer Neg Hx    Social History   Occupational History   Not on file  Tobacco Use   Smoking status: Never   Smokeless  tobacco: Never  Vaping Use   Vaping status: Never Used  Substance and Sexual Activity   Alcohol use: No   Drug use: No   Sexual activity: Not on file    Tobacco Counseling Counseling given: Not Answered   Immunizations and Health Maintenance Immunization History  Administered Date(s) Administered   Influenza, High Dose Seasonal PF 11/23/2016   Influenza, Seasonal, Injecte, Preservative Fre 12/27/2022   Influenza,inj,Quad PF,6+ Mos 11/23/2016, 01/02/2018, 11/04/2019, 11/29/2020, 11/30/2021   Influenza-Unspecified 11/26/2018   Moderna Sars-Covid-2 Vaccination 06/08/2019, 07/06/2019   PNEUMOCOCCAL CONJUGATE-20 06/27/2023   Tdap 10/12/2008, 01/04/2020   Zoster Recombinant(Shingrix) 11/29/2020, 03/17/2021   Zoster, Live 10/09/2012   Health Maintenance Due  Topic Date Due   COVID-19 Vaccine (3 - Moderna risk series) 08/03/2019   Colonoscopy  11/08/2023    Activities of Daily Living    08/15/2023     8:56 AM 08/08/2023    8:08 AM  In your present state of health, do you have any difficulty performing the following activities:  Hearing? 0 0  Vision? 0 0  Difficulty concentrating or making decisions? 0 0  Walking or climbing stairs? 0 0  Dressing or bathing?  0  Doing errands, shopping? 0 0  Preparing Food and eating ? N N  Using the Toilet? N N  In the past six months, have you accidently leaked urine? N N  Do you have problems with loss of bowel control? N N  Managing your Medications? N N  Managing your Finances? N N  Housekeeping or managing your Housekeeping? N N    Physical Exam   Physical Exam (optional), or other factors deemed appropriate based on the beneficiary's medical and social history and current clinical standards.   Advanced Directives: Does Patient Have a Medical Advance Directive?: No Would patient like information on creating a medical advance directive?: No - Patient declined  EKG:   normal sinus rhythm, with nonspecific T, with possible old infarct.      Assessment:    This is a routine wellness examination for this patient . Rash on bilateral arms, legs and face that started 5 days ago that is unchanged. Denies any fever, itching, or body aches.  Denies any new plants, medications, lotions, or detergents. Does states she has had a great deal of stress as her sister is on hospice.    Macular rash on bilateral arms and legs.   Vision/Hearing screen No results found.   Goals      DIET - EAT MORE FRUITS AND VEGETABLES        Depression Screen    08/15/2023    9:01 AM 06/27/2023   10:27 AM 09/25/2022    9:33 AM 06/25/2022    1:39 PM  PHQ 2/9 Scores  PHQ - 2 Score 0 0 0 0  PHQ- 9 Score 0 0 0 0     Fall Risk    08/15/2023    9:01 AM  Fall Risk   Falls in the past year? 0  Number falls in past yr: 0  Injury with Fall? 0  Risk for fall due to : No Fall Risks  Follow up Falls evaluation completed    Cognitive Function:         08/15/2023    9:02 AM  6CIT Screen  What Year? 0 points  What month? 0 points  What time? 0 points  Count back from 20 0 points  Months in reverse 0 points  Repeat phrase 0 points  Total Score 0 points    Patient Care Team: Lavell Bari LABOR, FNP as PCP - General (Family Medicine) Shaaron, Lamar HERO, MD as Consulting Physician (Gastroenterology)     Plan:    I have personally reviewed and noted the following in the patient's chart:   Medical and social history Use of alcohol, tobacco or illicit drugs  Current medications and supplements including opioid prescriptions. Patient is currently taking opioid prescriptions. Information provided to patient regarding non-opioid alternatives. Patient advised to discuss non-opioid treatment plan with their provider. Functional ability and status Nutritional status Physical activity Advanced directives List of other physicians Hospitalizations, surgeries, and ER visits in previous 12 months Vitals Screenings to include cognitive, depression, and falls Referrals and appointments  In addition, I have reviewed and discussed with patient certain preventive protocols, quality metrics, and best practice recommendations. A written personalized care plan for preventive services as well as general preventive health recommendations were provided to patient.     Bari Lavell, [place] 08/15/2023

## 2023-09-03 ENCOUNTER — Other Ambulatory Visit: Payer: Self-pay | Admitting: Family

## 2023-09-03 DIAGNOSIS — Z79899 Other long term (current) drug therapy: Secondary | ICD-10-CM

## 2023-09-03 DIAGNOSIS — G43109 Migraine with aura, not intractable, without status migrainosus: Secondary | ICD-10-CM

## 2023-09-03 DIAGNOSIS — R519 Headache, unspecified: Secondary | ICD-10-CM

## 2023-09-03 DIAGNOSIS — G8929 Other chronic pain: Secondary | ICD-10-CM

## 2023-09-04 ENCOUNTER — Telehealth: Payer: Self-pay | Admitting: Family

## 2023-09-04 NOTE — Telephone Encounter (Unsigned)
 Copied from CRM [id number]. Topic: Referral - Status >> Sep 04, 2023 11:43 AM Elle L wrote: Reason for CRM: The patient is requesting that her referral be sent to the Cardiologist in [place] across from [facility] instead of [place] [facility] [place]. She believes it is Chart[facility] loss adjuster at [place]. Address: [address] [place], [place] [address]. Phone: [phone].

## 2023-09-05 NOTE — Telephone Encounter (Signed)
 Referral sent

## 2023-09-05 NOTE — Telephone Encounter (Signed)
 Referral has been redirected to [facility] in [place] as Patient requested.

## 2023-09-23 ENCOUNTER — Other Ambulatory Visit: Payer: Self-pay | Admitting: Family

## 2023-09-23 DIAGNOSIS — G43109 Migraine with aura, not intractable, without status migrainosus: Secondary | ICD-10-CM

## 2023-09-23 DIAGNOSIS — I10 Essential (primary) hypertension: Secondary | ICD-10-CM

## 2023-09-27 ENCOUNTER — Ambulatory Visit: Admitting: Family

## 2023-10-01 ENCOUNTER — Other Ambulatory Visit: Payer: Self-pay | Admitting: Family

## 2023-10-01 DIAGNOSIS — Z79899 Other long term (current) drug therapy: Secondary | ICD-10-CM

## 2023-10-01 DIAGNOSIS — G8929 Other chronic pain: Secondary | ICD-10-CM

## 2023-10-07 ENCOUNTER — Ambulatory Visit (INDEPENDENT_AMBULATORY_CARE_PROVIDER_SITE_OTHER): Admitting: Family

## 2023-10-07 ENCOUNTER — Encounter: Payer: Self-pay | Admitting: Family

## 2023-10-07 VITALS — BP 121/81 | HR 73 | Temp 97.4°F | Ht 62.0 in | Wt 176.6 lb

## 2023-10-07 DIAGNOSIS — G4700 Insomnia, unspecified: Secondary | ICD-10-CM

## 2023-10-07 DIAGNOSIS — M170 Bilateral primary osteoarthritis of knee: Secondary | ICD-10-CM | POA: Diagnosis not present

## 2023-10-07 DIAGNOSIS — F321 Major depressive disorder, single episode, moderate: Secondary | ICD-10-CM | POA: Diagnosis not present

## 2023-10-07 DIAGNOSIS — M542 Cervicalgia: Secondary | ICD-10-CM | POA: Diagnosis not present

## 2023-10-07 DIAGNOSIS — Z0000 Encounter for general adult medical examination without abnormal findings: Secondary | ICD-10-CM

## 2023-10-07 DIAGNOSIS — K5900 Constipation, unspecified: Secondary | ICD-10-CM

## 2023-10-07 DIAGNOSIS — I10 Essential (primary) hypertension: Secondary | ICD-10-CM

## 2023-10-07 DIAGNOSIS — F411 Generalized anxiety disorder: Secondary | ICD-10-CM | POA: Diagnosis not present

## 2023-10-07 DIAGNOSIS — M25511 Pain in right shoulder: Secondary | ICD-10-CM

## 2023-10-07 DIAGNOSIS — E7800 Pure hypercholesterolemia, unspecified: Secondary | ICD-10-CM

## 2023-10-07 DIAGNOSIS — Z79899 Other long term (current) drug therapy: Secondary | ICD-10-CM | POA: Diagnosis not present

## 2023-10-07 DIAGNOSIS — G43109 Migraine with aura, not intractable, without status migrainosus: Secondary | ICD-10-CM

## 2023-10-07 DIAGNOSIS — R519 Headache, unspecified: Secondary | ICD-10-CM

## 2023-10-07 DIAGNOSIS — L910 Hypertrophic scar: Secondary | ICD-10-CM

## 2023-10-07 DIAGNOSIS — G8929 Other chronic pain: Secondary | ICD-10-CM

## 2023-10-07 DIAGNOSIS — Z0001 Encounter for general adult medical examination with abnormal findings: Secondary | ICD-10-CM | POA: Diagnosis not present

## 2023-10-07 MED ORDER — AMLODIPINE BESYLATE 5 MG PO TABS: 5.0000 mg | ORAL_TABLET | Freq: Every day | ORAL | 0 refills | Status: DC

## 2023-10-07 MED ORDER — TRAMADOL HCL 50 MG PO TABS
50.0000 mg | ORAL_TABLET | Freq: Two times a day (BID) | ORAL | 2 refills | Status: DC | PRN
Start: 2023-10-07 — End: 2024-01-07

## 2023-10-07 MED ORDER — HYDROCHLOROTHIAZIDE 25 MG PO TABS: 25.0000 mg | ORAL_TABLET | Freq: Every day | ORAL | 3 refills | Status: AC

## 2023-10-07 MED ORDER — SUMATRIPTAN SUCCINATE 50 MG PO TABS: ORAL_TABLET | ORAL | 0 refills | Status: DC

## 2023-10-07 MED ORDER — TOPIRAMATE 100 MG PO TABS: 100.0000 mg | ORAL_TABLET | Freq: Two times a day (BID) | ORAL | 0 refills | Status: DC

## 2023-10-07 MED ORDER — LOSARTAN POTASSIUM 100 MG PO TABS
100.0000 mg | ORAL_TABLET | Freq: Every day | ORAL | 0 refills | Status: DC
Start: 2023-10-07 — End: 2024-01-07

## 2023-10-07 MED ORDER — DESVENLAFAXINE SUCCINATE ER 50 MG PO TB24: 50.0000 mg | ORAL_TABLET | Freq: Every day | ORAL | 0 refills | Status: DC

## 2023-10-07 NOTE — Patient Instructions (Signed)
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
## 2023-10-08 ENCOUNTER — Ambulatory Visit: Payer: Self-pay | Admitting: Family

## 2023-10-08 LAB — CBC WITH DIFFERENTIAL/PLATELET
Basophils Absolute: 0.1 x10E3/uL (ref 0.0–0.2)
Basos: 1 %
EOS (ABSOLUTE): 0.1 x10E3/uL (ref 0.0–0.4)
Eos: 2 %
Hematocrit: 43.8 % (ref 34.0–46.6)
Hemoglobin: 14.3 g/dL (ref 11.1–15.9)
Immature Grans (Abs): 0 x10E3/uL (ref 0.0–0.1)
Immature Granulocytes: 0 %
Lymphocytes Absolute: 1.8 x10E3/uL (ref 0.7–3.1)
Lymphs: 28 %
MCH: 31 pg (ref 26.6–33.0)
MCHC: 32.6 g/dL (ref 31.5–35.7)
MCV: 95 fL (ref 79–97)
Monocytes Absolute: 0.3 x10E3/uL (ref 0.1–0.9)
Monocytes: 5 %
Neutrophils Absolute: 4.2 x10E3/uL (ref 1.4–7.0)
Neutrophils: 64 %
Platelets: 395 x10E3/uL (ref 150–450)
RBC: 4.62 x10E6/uL (ref 3.77–5.28)
RDW: 13.5 % (ref 11.7–15.4)
WBC: 6.5 x10E3/uL (ref 3.4–10.8)

## 2023-10-08 LAB — LIPID PANEL
Chol/HDL Ratio: 4.6 ratio — ABNORMAL HIGH (ref 0.0–4.4)
Cholesterol, Total: 198 mg/dL (ref 100–199)
HDL: 43 mg/dL (ref >39)
LDL Chol Calc (NIH): 120 mg/dL — ABNORMAL HIGH (ref 0–99)
Triglycerides: 197 mg/dL — ABNORMAL HIGH (ref 0–149)
VLDL Cholesterol Cal: 35 mg/dL (ref 5–40)

## 2023-10-08 LAB — CMP14+EGFR
ALT: 22 IU/L (ref 0–32)
AST: 17 IU/L (ref 0–40)
Albumin: 4.3 g/dL (ref 3.9–4.9)
Alkaline Phosphatase: 107 IU/L (ref 44–121)
BUN/Creatinine Ratio: 16 (ref 12–28)
BUN: 13 mg/dL (ref 8–27)
Bilirubin Total: 0.3 mg/dL (ref 0.0–1.2)
CO2: 22 mmol/L (ref 20–29)
Calcium: 9.7 mg/dL (ref 8.7–10.3)
Chloride: 105 mmol/L (ref 96–106)
Creatinine, Ser: 0.79 mg/dL (ref 0.57–1.00)
Globulin, Total: 2.7 g/dL (ref 1.5–4.5)
Glucose: 103 mg/dL — ABNORMAL HIGH (ref 70–99)
Potassium: 3.9 mmol/L (ref 3.5–5.2)
Sodium: 141 mmol/L (ref 134–144)
Total Protein: 7 g/dL (ref 6.0–8.5)
eGFR: 83 mL/min/1.73 (ref >59)

## 2023-10-08 LAB — TSH: TSH: 1.19 u[IU]/mL (ref 0.450–4.500)

## 2023-10-09 LAB — TOXASSURE SELECT 13 (MW), URINE

## 2023-11-19 ENCOUNTER — Other Ambulatory Visit: Payer: Self-pay | Admitting: Family

## 2023-11-19 ENCOUNTER — Ambulatory Visit: Admitting: Cardiology

## 2023-11-19 DIAGNOSIS — G43109 Migraine with aura, not intractable, without status migrainosus: Secondary | ICD-10-CM

## 2023-11-19 DIAGNOSIS — R519 Headache, unspecified: Secondary | ICD-10-CM

## 2023-12-02 ENCOUNTER — Other Ambulatory Visit: Payer: Self-pay | Admitting: Family

## 2023-12-02 DIAGNOSIS — G8929 Other chronic pain: Secondary | ICD-10-CM

## 2023-12-02 DIAGNOSIS — Z79899 Other long term (current) drug therapy: Secondary | ICD-10-CM

## 2023-12-17 ENCOUNTER — Encounter: Payer: Self-pay | Admitting: Cardiology

## 2023-12-17 ENCOUNTER — Ambulatory Visit: Attending: Cardiology | Admitting: Cardiology

## 2023-12-17 VITALS — BP 122/68 | HR 78 | Resp 16 | Ht 62.0 in | Wt 181.2 lb

## 2023-12-17 DIAGNOSIS — I10 Essential (primary) hypertension: Secondary | ICD-10-CM | POA: Diagnosis not present

## 2023-12-17 DIAGNOSIS — R9431 Abnormal electrocardiogram [ECG] [EKG]: Secondary | ICD-10-CM | POA: Diagnosis not present

## 2023-12-17 DIAGNOSIS — E782 Mixed hyperlipidemia: Secondary | ICD-10-CM

## 2023-12-17 NOTE — Patient Instructions (Signed)
 Medication Instructions:  Your physician recommends that you continue on your current medications as directed. Please refer to the Current Medication list given to you today.  *If you need a refill on your cardiac medications before your next appointment, please call your pharmacy*   Testing/Procedures: Your physician has requested that you have an echocardiogram. Echocardiography is a painless test that uses sound waves to create images of your heart. It provides your doctor with information about the size and shape of your heart and how well your heart's chambers and valves are working. This procedure takes approximately one hour. There are no restrictions for this procedure. Please do NOT wear cologne, perfume, aftershave, or lotions (deodorant is allowed). Please arrive 15 minutes prior to your appointment time.  Please note: We ask at that you not bring children with you during ultrasound (echo/ vascular) testing. Due to room size and safety concerns, children are not allowed in the ultrasound rooms during exams. Our front office staff cannot provide observation of children in our lobby area while testing is being conducted. An adult accompanying a patient to their appointment will only be allowed in the ultrasound room at the discretion of the ultrasound technician under special circumstances. We apologize for any inconvenience.   Follow-Up: At [facility], you and your health needs are our priority.  As part of our continuing mission to provide you with exceptional heart care, our providers are all part of one team.  This team includes your primary Cardiologist (physician) and Advanced Practice Providers or APPs (Physician Assistants and Nurse Practitioners) who all work together to provide you with the care you need, when you need it.  Your next appointment:   as needed  Provider:   Dr. Michele   Thank you for choosing [facility]!!   [phone]

## 2023-12-17 NOTE — Progress Notes (Signed)
 Cardiology Office Note:    Date:  12/17/2023  NAME:  Monica Cross    MRN: [id number] DOB:  1955-04-30   PCP:  Lavell Bari LABOR, FNP  Former Cardiology Providers: None Primary Cardiologist:  Madonna Large, DO, [facility] (established care 12/17/2023) Electrophysiologist:  None   Referring MD: Lavell Bari LABOR, FNP  Reason of Consult: Abnormal EKG/family history of heart disease/risk factors  Chief Complaint  Patient presents with   Abnormal ECG    History of Present Illness:    Monica Cross is a 68 y.o. Caucasian female whose past medical history and cardiovascular risk factors includes: Hypertension, hyperlipidemia, hx of carpal tunnel s/p release. She is being seen today for the evaluation of Abnormal EKG/family history of heart disease/risk factors at the request of Lavell Bari LABOR, FNP.  During a routine physical examination in July 2025, an EKG showed sinus rhythm, low voltage in the precordial leads, poor R wave progression, and nonspecific T wave abnormalities. These findings led to her referral for further evaluation.  She denies chest pain, shortness of breath, lightheadedness, dizziness, smoking, heart attacks, stents, or blood clots. Her family history is significant for heart disease; her mother underwent open heart surgery and died at 73.  She experiences persistent headaches despite medication, currently following up with PCP.    Current Medications: Current Meds  Medication Sig   amLODipine  (NORVASC ) 5 MG tablet Take 1 tablet (5 mg total) by mouth daily.   atorvastatin  (LIPITOR) 40 MG tablet Take 1 tablet (40 mg total) by mouth daily.   desvenlafaxine  (PRISTIQ ) 50 MG 24 hr tablet Take 1 tablet (50 mg total) by mouth daily.   hydrochlorothiazide  (HYDRODIURIL ) 25 MG tablet Take 1 tablet (25 mg total) by mouth daily.   losartan  (COZAAR ) 100 MG tablet Take 1 tablet (100 mg total) by mouth daily.   Omega-3 1000 MG CAPS Take 1,000 mg by mouth daily.   SUMAtriptan  (IMITREX )  50 MG tablet TAKE ONE TABLET AS NEEDED FOR HEADACHE. May repeat in 2 hours if headache persists or recurs.   topiramate  (TOPAMAX ) 100 MG tablet Take 1 tablet (100 mg total) by mouth 2 (two) times daily.   traMADol  (ULTRAM ) 50 MG tablet Take 1 tablet (50 mg total) by mouth every 12 (twelve) hours as needed.     Allergies:    Patient has no known allergies.   Past Medical History: Past Medical History:  Diagnosis Date   Anxiety    Arthritis    Left and right hip   Carpal tunnel syndrome    Bilat   Depression    Headache    Hyperlipidemia    Hypertension    Insomnia    patient denies   Osteoporosis    patient denies   Vision abnormalities     Past Surgical History: Past Surgical History:  Procedure Laterality Date   ANTERIOR CERVICAL DECOMP/DISCECTOMY FUSION N/A 09/14/2020   Procedure: ANTERIOR CERVICAL DECOMPRESSION FUSION CERVICAL 3- CERVICAL 4, CERVICAL 4- CERVICAL 5 WITH INSTRUMENTATION AND ALLOGRAFT;  Surgeon: Beuford Anes, MD;  Location: [facility] OR;  Service: Orthopedics;  Laterality: N/A;   CARPAL TUNNEL RELEASE     CESAREAN SECTION     COLONOSCOPY WITH PROPOFOL  N/A 11/07/2020   Procedure: COLONOSCOPY WITH PROPOFOL ;  Surgeon: Shaaron Lamar HERO, MD;  Location: [facility] ENDO SUITE;  Service: Endoscopy;  Laterality: N/A;  10:45am   COSMETIC SURGERY     POLYPECTOMY  11/07/2020   Procedure: POLYPECTOMY;  Surgeon: Shaaron Lamar HERO, MD;  Location: [facility]  ENDO SUITE;  Service: Endoscopy;;    Social History: Social History   Tobacco Use   Smoking status: Never   Smokeless tobacco: Never  Vaping Use   Vaping status: Never Used  Substance Use Topics   Alcohol use: No   Drug use: No    Family History: Family History  Problem Relation Age of Onset   Heart disease Mother    Hypertension Mother    Other Father        head injury   Stroke Sister    Osteoporosis Sister    Lung cancer Brother    Liver cancer Brother    Breast cancer Sister    Osteoporosis Sister    COPD Sister     Osteoporosis Sister    Healthy Sister    Osteoporosis Sister    Other Sister        benign brain tumor   Osteoporosis Sister    Colon cancer Neg Hx     ROS:   Review of Systems  Cardiovascular:  Negative for chest pain, claudication, irregular heartbeat, leg swelling, near-syncope, orthopnea, palpitations, paroxysmal nocturnal dyspnea and syncope.  Respiratory:  Negative for shortness of breath.   Hematologic/Lymphatic: Negative for bleeding problem.  Neurological:  Positive for headaches (chronic).    EKGs/Labs/Other Studies Reviewed:   EKG: EKG Interpretation Date/Time:  Tuesday December 17 2023 09:20:26 EST Ventricular Rate:  81 PR Interval:  130 QRS Duration:  84 QT Interval:  382 QTC Calculation: 443 R Axis:   87  Text Interpretation: Normal sinus rhythm Low voltage QRS When compared with ECG of 05-Sep-2020 14:55, No significant change was found Confirmed by Michele Richardson [phone]) on 12/17/2023 9:37:57 AM  Echocardiogram: None  Stress Testing:  None  Labs:    Latest Ref Rng & Units 10/07/2023   10:45 AM 12/27/2022   10:59 AM 11/30/2021    9:01 AM  CBC  WBC 3.4 - 10.8 x10E3/uL 6.5  5.4  6.2   Hemoglobin 11.1 - 15.9 g/dL 85.6  87.0  86.6   Hematocrit 34.0 - 46.6 % 43.8  39.4  40.1   Platelets 150 - 450 x10E3/uL 395  386  447        Latest Ref Rng & Units 10/07/2023   10:45 AM 06/27/2023   11:02 AM 12/27/2022   10:59 AM  BMP  Glucose 70 - 99 mg/dL 896  98  897   BUN 8 - 27 mg/dL 13  21  21    Creatinine 0.57 - 1.00 mg/dL 9.20  8.98  9.17   BUN/Creat Ratio 12 - 28 16  21  26    Sodium 134 - 144 mmol/L 141  139  142   Potassium 3.5 - 5.2 mmol/L 3.9  3.8  4.2   Chloride 96 - 106 mmol/L 105  100  102   CO2 20 - 29 mmol/L 22  24  25    Calcium  8.7 - 10.3 mg/dL 9.7  9.9  9.8       Latest Ref Rng & Units 10/07/2023   10:45 AM 06/27/2023   11:02 AM 12/27/2022   10:59 AM  CMP  Glucose 70 - 99 mg/dL 896  98  897   BUN 8 - 27 mg/dL 13  21  21    Creatinine 0.57 -  1.00 mg/dL 9.20  8.98  9.17   Sodium 134 - 144 mmol/L 141  139  142   Potassium 3.5 - 5.2 mmol/L 3.9  3.8  4.2   Chloride  96 - 106 mmol/L 105  100  102   CO2 20 - 29 mmol/L 22  24  25    Calcium  8.7 - 10.3 mg/dL 9.7  9.9  9.8   Total Protein 6.0 - 8.5 g/dL 7.0  6.4  6.4   Total Bilirubin 0.0 - 1.2 mg/dL 0.3  0.5  0.3   Alkaline Phos 44 - 121 IU/L 107  104  103   AST 0 - 40 IU/L 17  19  19    ALT 0 - 32 IU/L 22  21  20      Lab Results  Component Value Date   CHOL 198 10/07/2023   HDL 43 10/07/2023   LDLCALC 120 (H) 10/07/2023   TRIG 197 (H) 10/07/2023   CHOLHDL 4.6 (H) 10/07/2023   No results for input(s): LIPOA in the last 8760 hours. No components found for: NTPROBNP No results for input(s): PROBNP in the last 8760 hours. Recent Labs    10/07/23 1045  TSH 1.190    Physical Exam:    Today's Vitals   12/17/23 0922  BP: 122/68  Pulse: 78  Resp: 16  SpO2: 97%  Weight: 181 lb 3.2 oz (82.2 kg)  Height: 5' 2 (1.575 m)   Body mass index is 33.14 kg/m. Wt Readings from Last 3 Encounters:  12/17/23 181 lb 3.2 oz (82.2 kg)  10/07/23 176 lb 9.6 oz (80.1 kg)  08/15/23 175 lb 9.6 oz (79.7 kg)    Physical Exam  Constitutional: No distress.  hemodynamically stable  Neck: No JVD present.  Cardiovascular: Normal rate, regular rhythm, S1 normal and S2 normal. Exam reveals no gallop, no S3 and no S4.  No murmur heard. Pulmonary/Chest: Effort normal and breath sounds normal. No stridor. She has no wheezes. She has no rales.  Musculoskeletal:        General: No edema.     Cervical back: Neck supple.  Skin: Skin is warm.     Impression & Recommendation(s):  Impression:   ICD-10-CM   1. Nonspecific abnormal electrocardiogram (ECG) (EKG)  R94.31 EKG 12-Lead    ECHOCARDIOGRAM COMPLETE    2. Benign hypertension  I10     3. Mixed hyperlipidemia  E78.2        Recommendation(s):  Nonspecific abnormal electrocardiogram (ECG) (EKG) EKG independently reviewed, sinus  rhythm, low voltage in precordial leads, poor R wave progression, nonspecific T wave abnormality. Had a long discussion with the patient that this could be secondary to either organic disease versus technical factors and obtaining EKG. Clinically she denies anginal chest pain or heart failure symptoms. Patient does have a family history of cardiac disease, it is not premature. Reemphasized the importance of improving her modifiable cardiovascular risk factors. For further clarity will order an echocardiogram to evaluate for cardiac function, if compromised additional workup would be warranted or if she develops any symptoms in the interim.   For now would recommend as needed follow-up sooner if needed based on test results or new onset of symptoms.   Patient agreeable with the plan of care.  Benign hypertension Office blood pressures are very well-controlled. Continue amlodipine  5 mg p.o. daily. Continue hydrochlorothiazide  25 mg p.o. daily. Continue losartan  100 mg p.o. daily  Mixed hyperlipidemia Most recent labs from 10/07/2023 independently reviewed. LDL and triglycerides were not well-controlled. Currently on Lipitor 40 mg p.o. nightly Recommend that she follows up with PCP for repeat labs and uptitrate lipid-lowering agents if needed Recommend goal LDL at least <70 mg/dL. Recommend a goal  triglyceride levels <149 mg/dL  Patient endorses chronic headaches which has not worsened in intensity frequency or duration.  No focal neurological deficits.  Currently on sumatriptan  and Topamax .  Recommended that she follows up with her provider for additional evaluation and management.  If she develops worsening headache, focal neurological deficits, unexplained nausea or vomiting she is advised to go to the closest ER via EMS to rule out stroke.   Orders Placed:  Orders Placed This Encounter  Procedures   EKG 12-Lead   ECHOCARDIOGRAM COMPLETE    Standing Status:   Future    Expiration Date:    12/16/2024    Where [facility] this test [facility] performed[facility]    Does the patient weigh less than or greater than 250 lbs?:   Patient weighs less than 250 lbs    Perflutren DEFINITY (image enhancing agent) should be administered unless hypersensitivity or allergy exist:   Administer Perflutren    Reason for exam-Echo:   Abnormal ECG  R94.31     Final Medication List:   No orders of the defined types were placed in this encounter.   Medications Discontinued During This Encounter  Medication Reason   triamcinolone  ointment (KENALOG ) 0.5 % Patient Preference     Current Outpatient Medications:    amLODipine  (NORVASC ) 5 MG tablet, Take 1 tablet (5 mg total) by mouth daily., Disp: 90 tablet, Rfl: 0   atorvastatin  (LIPITOR) 40 MG tablet, Take 1 tablet (40 mg total) by mouth daily., Disp: 90 tablet, Rfl: 4   desvenlafaxine  (PRISTIQ ) 50 MG 24 hr tablet, Take 1 tablet (50 mg total) by mouth daily., Disp: 90 tablet, Rfl: 0   hydrochlorothiazide  (HYDRODIURIL ) 25 MG tablet, Take 1 tablet (25 mg total) by mouth daily., Disp: 90 tablet, Rfl: 3   losartan  (COZAAR ) 100 MG tablet, Take 1 tablet (100 mg total) by mouth daily., Disp: 90 tablet, Rfl: 0   Omega-3 1000 MG CAPS, Take 1,000 mg by mouth daily., Disp: , Rfl:    SUMAtriptan  (IMITREX ) 50 MG tablet, TAKE ONE TABLET AS NEEDED FOR HEADACHE. May repeat in 2 hours if headache persists or recurs., Disp: 9 tablet, Rfl: 1   topiramate  (TOPAMAX ) 100 MG tablet, Take 1 tablet (100 mg total) by mouth 2 (two) times daily., Disp: 180 tablet, Rfl: 0   traMADol  (ULTRAM ) 50 MG tablet, Take 1 tablet (50 mg total) by mouth every 12 (twelve) hours as needed., Disp: 60 tablet, Rfl: 2  Consent:   N/A  Disposition:   As needed Patient may be asked to follow-up sooner based on the results of the above-mentioned testing.  Her questions and concerns were addressed to her satisfaction. She voices understanding of the recommendations provided during this  encounter.    Signed, Madonna Michele HAS, [facility]  A Division of [facility] [address], [place], [place] [address]

## 2023-12-22 ENCOUNTER — Encounter: Payer: Self-pay | Admitting: Cardiology

## 2023-12-25 ENCOUNTER — Other Ambulatory Visit: Payer: Self-pay | Admitting: *Deleted

## 2023-12-25 DIAGNOSIS — R519 Headache, unspecified: Secondary | ICD-10-CM

## 2023-12-25 DIAGNOSIS — G43109 Migraine with aura, not intractable, without status migrainosus: Secondary | ICD-10-CM

## 2024-01-01 ENCOUNTER — Ambulatory Visit: Admitting: Cardiology

## 2024-01-07 ENCOUNTER — Ambulatory Visit (INDEPENDENT_AMBULATORY_CARE_PROVIDER_SITE_OTHER): Payer: Self-pay | Admitting: Family

## 2024-01-07 ENCOUNTER — Other Ambulatory Visit (HOSPITAL_BASED_OUTPATIENT_CLINIC_OR_DEPARTMENT_OTHER): Payer: Self-pay

## 2024-01-07 ENCOUNTER — Telehealth: Payer: Self-pay | Admitting: Pharmacy Technician

## 2024-01-07 ENCOUNTER — Ambulatory Visit

## 2024-01-07 ENCOUNTER — Encounter: Payer: Self-pay | Admitting: Family

## 2024-01-07 VITALS — BP 111/72 | HR 76 | Temp 97.3°F | Ht 62.0 in | Wt 182.2 lb

## 2024-01-07 DIAGNOSIS — E7800 Pure hypercholesterolemia, unspecified: Secondary | ICD-10-CM

## 2024-01-07 DIAGNOSIS — Z79899 Other long term (current) drug therapy: Secondary | ICD-10-CM

## 2024-01-07 DIAGNOSIS — G8929 Other chronic pain: Secondary | ICD-10-CM

## 2024-01-07 DIAGNOSIS — F411 Generalized anxiety disorder: Secondary | ICD-10-CM | POA: Diagnosis not present

## 2024-01-07 DIAGNOSIS — M542 Cervicalgia: Secondary | ICD-10-CM

## 2024-01-07 DIAGNOSIS — Z13820 Encounter for screening for osteoporosis: Secondary | ICD-10-CM

## 2024-01-07 DIAGNOSIS — F321 Major depressive disorder, single episode, moderate: Secondary | ICD-10-CM

## 2024-01-07 DIAGNOSIS — G4700 Insomnia, unspecified: Secondary | ICD-10-CM

## 2024-01-07 DIAGNOSIS — Z23 Encounter for immunization: Secondary | ICD-10-CM

## 2024-01-07 DIAGNOSIS — G43109 Migraine with aura, not intractable, without status migrainosus: Secondary | ICD-10-CM

## 2024-01-07 DIAGNOSIS — Z1211 Encounter for screening for malignant neoplasm of colon: Secondary | ICD-10-CM

## 2024-01-07 DIAGNOSIS — M170 Bilateral primary osteoarthritis of knee: Secondary | ICD-10-CM

## 2024-01-07 DIAGNOSIS — I10 Essential (primary) hypertension: Secondary | ICD-10-CM | POA: Diagnosis not present

## 2024-01-07 DIAGNOSIS — Z780 Asymptomatic menopausal state: Secondary | ICD-10-CM | POA: Diagnosis not present

## 2024-01-07 DIAGNOSIS — K5900 Constipation, unspecified: Secondary | ICD-10-CM

## 2024-01-07 LAB — CMP14+EGFR
ALT: 19 IU/L (ref 0–32)
AST: 18 IU/L (ref 0–40)
Albumin: 4.1 g/dL (ref 3.9–4.9)
Alkaline Phosphatase: 106 IU/L (ref 49–135)
BUN/Creatinine Ratio: 16 (ref 12–28)
BUN: 14 mg/dL (ref 8–27)
Bilirubin Total: 0.5 mg/dL (ref 0.0–1.2)
CO2: 24 mmol/L (ref 20–29)
Calcium: 10 mg/dL (ref 8.7–10.3)
Chloride: 100 mmol/L (ref 96–106)
Creatinine, Ser: 0.88 mg/dL (ref 0.57–1.00)
Globulin, Total: 2.4 g/dL (ref 1.5–4.5)
Glucose: 103 mg/dL — ABNORMAL HIGH (ref 70–99)
Potassium: 3.9 mmol/L (ref 3.5–5.2)
Sodium: 138 mmol/L (ref 134–144)
Total Protein: 6.5 g/dL (ref 6.0–8.5)
eGFR: 73 mL/min/1.73 (ref >59)

## 2024-01-07 MED ORDER — DESVENLAFAXINE SUCCINATE ER 50 MG PO TB24
50.0000 mg | ORAL_TABLET | Freq: Every day | ORAL | 0 refills | Status: AC
Start: 2024-01-07 — End: ?

## 2024-01-07 MED ORDER — AMLODIPINE BESYLATE 5 MG PO TABS
5.0000 mg | ORAL_TABLET | Freq: Every day | ORAL | 0 refills | Status: AC
Start: 2024-01-07 — End: ?

## 2024-01-07 MED ORDER — EMGALITY 120 MG/ML ~~LOC~~ SOAJ: 1.0000 mg | SUBCUTANEOUS | 11 refills | Status: AC

## 2024-01-07 MED ORDER — TRAMADOL HCL 50 MG PO TABS: 50.0000 mg | ORAL_TABLET | Freq: Two times a day (BID) | ORAL | 2 refills | Status: AC | PRN

## 2024-01-07 MED ORDER — TOPIRAMATE 100 MG PO TABS: 100.0000 mg | ORAL_TABLET | Freq: Two times a day (BID) | ORAL | 0 refills | Status: AC

## 2024-01-07 MED ORDER — LOSARTAN POTASSIUM 100 MG PO TABS
100.0000 mg | ORAL_TABLET | Freq: Every day | ORAL | 0 refills | Status: AC
Start: 2024-01-07 — End: ?
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
## 2024-01-07 NOTE — Telephone Encounter (Signed)
 Pharmacy Patient Advocate Encounter  Received notification from [employer] that Prior Authorization for Emgality  120MG /ML auto-injectors has been APPROVED from 01/07/24 to 02/11/25. Ran test claim, Copay is $0.00. This test claim was processed through [facility]- copay amounts may vary at other pharmacies due to pharmacy/plan contracts, or as the patient moves through the different stages of their insurance plan.   PA #/Case ID/Reference #: [id number]

## 2024-01-07 NOTE — Patient Instructions (Signed)

## 2024-01-07 NOTE — Telephone Encounter (Signed)
 Pharmacy Patient Advocate Encounter   Received notification from Onbase that prior authorization for Emgality  120MG /ML auto-injectors  is required/requested.   Insurance verification completed.   The patient is insured through [employer].   Per test claim: PA required; PA started via CoverMyMeds. KEY [id number] . Waiting for clinical questions to populate.

## 2024-01-13 ENCOUNTER — Ambulatory Visit: Payer: Self-pay | Admitting: Family

## 2024-01-13 ENCOUNTER — Other Ambulatory Visit (HOSPITAL_COMMUNITY): Payer: Self-pay

## 2024-01-16 ENCOUNTER — Ambulatory Visit (HOSPITAL_COMMUNITY)
Admission: RE | Admit: 2024-01-16 | Discharge: 2024-01-16 | Disposition: A | Source: Ambulatory Visit | Attending: Vascular Surgery

## 2024-01-16 DIAGNOSIS — R9431 Abnormal electrocardiogram [ECG] [EKG]: Secondary | ICD-10-CM | POA: Diagnosis present

## 2024-01-16 LAB — ECHOCARDIOGRAM COMPLETE
AV Mean grad: 3 mmHg
AV Peak grad: 6.1 mmHg
Ao pk vel: 1.23 m/s
Area-P 1/2: 3.33 cm2

## 2024-01-21 ENCOUNTER — Ambulatory Visit: Payer: Self-pay | Admitting: Cardiology

## 2024-03-04 ENCOUNTER — Other Ambulatory Visit: Payer: Self-pay | Admitting: Family

## 2024-03-04 DIAGNOSIS — Z79899 Other long term (current) drug therapy: Secondary | ICD-10-CM

## 2024-03-04 DIAGNOSIS — G8929 Other chronic pain: Secondary | ICD-10-CM

## 2024-03-16 ENCOUNTER — Encounter: Payer: Self-pay | Admitting: Gastroenterology

## 2024-04-09 ENCOUNTER — Ambulatory Visit: Admitting: Family
